# Patient Record
Sex: Male | Born: 1945
Health system: Southern US, Community
[De-identification: ages and names within clinical notes are randomized; demographics above are authoritative.]

## PROBLEM LIST (undated history)

## (undated) DIAGNOSIS — C801 Malignant (primary) neoplasm, unspecified: Secondary | ICD-10-CM

## (undated) HISTORY — PX: TONSILLECTOMY: SUR1361

## (undated) HISTORY — DX: Malignant (primary) neoplasm, unspecified: C80.1

---

## 2003-11-22 ENCOUNTER — Ambulatory Visit: Payer: Self-pay | Admitting: Internal Medicine

## 2003-12-20 ENCOUNTER — Ambulatory Visit: Payer: Self-pay | Admitting: Internal Medicine

## 2003-12-26 ENCOUNTER — Ambulatory Visit: Payer: Self-pay | Admitting: Internal Medicine

## 2004-11-14 ENCOUNTER — Ambulatory Visit: Payer: Self-pay | Admitting: Internal Medicine

## 2006-11-19 ENCOUNTER — Ambulatory Visit: Payer: Self-pay | Admitting: Internal Medicine

## 2006-11-19 LAB — CONVERTED CEMR LAB
ALT: 21 units/L (ref 0–53)
Alkaline Phosphatase: 55 units/L (ref 39–117)
BUN: 14 mg/dL (ref 6–23)
Basophils Relative: 1 % (ref 0.0–1.0)
Bilirubin, Direct: 0.1 mg/dL (ref 0.0–0.3)
CO2: 32 meq/L (ref 19–32)
Calcium: 10 mg/dL (ref 8.4–10.5)
Creatinine, Ser: 0.9 mg/dL (ref 0.4–1.5)
Eosinophils Relative: 1.9 % (ref 0.0–5.0)
Glucose, Bld: 102 mg/dL — ABNORMAL HIGH (ref 70–99)
HDL: 50.9 mg/dL (ref >39.0)
Hemoglobin: 15 g/dL (ref 13.0–17.0)
Ketones, urine, test strip: NEGATIVE
LDL Cholesterol: 118 mg/dL — ABNORMAL HIGH (ref 0–99)
Lymphocytes Relative: 29.8 % (ref 12.0–46.0)
Monocytes Absolute: 0.5 10*3/uL (ref 0.2–0.7)
Monocytes Relative: 8.2 % (ref 3.0–11.0)
Nitrite: NEGATIVE
Platelets: 318 10*3/uL (ref 150–400)
RDW: 12.5 % (ref 11.5–14.6)
Total Bilirubin: 0.9 mg/dL (ref 0.3–1.2)
Total Protein: 6.6 g/dL (ref 6.0–8.3)
Urobilinogen, UA: 0.2
VLDL: 22 mg/dL (ref 0–40)
WBC Urine, dipstick: NEGATIVE
WBC: 6.7 10*3/uL (ref 4.5–10.5)
pH: 7.5

## 2006-12-01 ENCOUNTER — Ambulatory Visit: Payer: Self-pay | Admitting: Internal Medicine

## 2008-05-03 ENCOUNTER — Encounter: Payer: Self-pay | Admitting: Internal Medicine

## 2009-05-25 ENCOUNTER — Encounter: Payer: Self-pay | Admitting: Internal Medicine

## 2009-09-17 ENCOUNTER — Encounter: Payer: Self-pay | Admitting: Internal Medicine

## 2009-10-10 ENCOUNTER — Ambulatory Visit: Payer: Self-pay | Admitting: Internal Medicine

## 2009-10-11 LAB — CONVERTED CEMR LAB
AST: 21 units/L (ref 0–37)
Alkaline Phosphatase: 62 units/L (ref 39–117)
BUN: 13 mg/dL (ref 6–23)
Bilirubin, Direct: 0.1 mg/dL (ref 0.0–0.3)
Chloride: 104 meq/L (ref 96–112)
GFR calc non Af Amer: 81.51 mL/min (ref >60)
LDL Cholesterol: 108 mg/dL — ABNORMAL HIGH (ref 0–99)
Potassium: 4.9 meq/L (ref 3.5–5.1)
Sodium: 141 meq/L (ref 135–145)
TSH: 1.19 microintl units/mL (ref 0.35–5.50)
Total Bilirubin: 0.8 mg/dL (ref 0.3–1.2)
Total CHOL/HDL Ratio: 3
VLDL: 15.2 mg/dL (ref 0.0–40.0)

## 2009-12-04 ENCOUNTER — Telehealth: Payer: Self-pay | Admitting: Internal Medicine

## 2010-02-05 NOTE — Miscellaneous (Signed)
Summary: Flu Shot/[place]  Flu Shot/[place]   Imported By: Maryln Gottron 06/22/2009 11:03:22  _____________________________________________________________________  External Attachment:    Type:   Image     Comment:   External Document

## 2010-02-05 NOTE — Assessment & Plan Note (Signed)
Summary: CPX (PT WILL COME IN FASTING) // RS   Vital Signs:  Patient profile:   65 year old male Height:      71 inches Weight:      189 pounds BMI:     26.46 Temp:     97.7 degrees F oral BP sitting:   100 / 64  (right arm) Cuff size:   regular  Vitals Entered By: Duard Brady LPN (October 10, 2009 9:46 AM) CC: cpx - doing ok - cold sx Is Patient Diabetic? No   CC:  cpx - doing ok - cold sx.  History of Present Illness: 65 year old patient who is seen today  for a comprehensive evaluation.  He enjoys excellent health.He is followed by urology for ED and testosterone deficiency  Here for Medicare AWV:  1.   Risk factors based on Past M, S, F history: no  cardiovascular risk factors 2.   Physical Activities:  walks almost daily 3.   Depression/mood: no history of depression or mood disorder 4.   Hearing: no deficits 5.   ADL's: independent in all aspects of daily living 6.   Fall Risk: low 7.   Home Safety: no cause identified 8.   Height, weight, &visual acuity:height and weight stable.  No difficulty with visual acuity 9.   Counseling: regular exercise, heart healthy diet.  All encouraged 10.   Labs ordered based on risk factors: laboratory profile, including lipid panel will be reviewed 11.           Referral Coordination- and appropriate at this time.  Follow up urology in 6 months 12.           Care Plan- continue present testosterone replacement therapy 13.            Cognitive Assessment- alert and oriented, with normal affect.  No cognitive dysfunction.  Memory intact, and able to handle all executive functions without difficulty.  Still works part-time as an Nutritional therapist    Allergies (verified): No Known Drug Allergies  Past History:  Past Medical History: Reviewed history from 12/01/2006 and no changes required. history of basal cell cancer ED androgen deficiency  Past Surgical History: Tonsillectomy colonoscopy Deboraha Sprang 2010)  Family  History: Reviewed history from 12/01/2006 and no changes required. father died age 39 of lung cancer, history of coronary artery disease mother died at age 59history of dementia  one brother died at age 21 apparent MI one older brother as well  Social History: Reviewed history from 12/01/2006 and no changes required. Married walks 4 miles [employer]  Review of Systems  The patient denies anorexia, fever, weight loss, weight gain, vision loss, decreased hearing, hoarseness, chest pain, syncope, dyspnea on exertion, peripheral edema, prolonged cough, headaches, hemoptysis, abdominal pain, melena, hematochezia, severe indigestion/heartburn, hematuria, incontinence, genital sores, muscle weakness, suspicious skin lesions, transient blindness, difficulty walking, depression, unusual weight change, abnormal bleeding, enlarged lymph nodes, angioedema, breast masses, and testicular masses.    Physical Exam  General:  Well-developed,well-nourished,in no acute distress; alert,appropriate and cooperative throughout examination Head:  Normocephalic and atraumatic without obvious abnormalities. No apparent alopecia or balding. Eyes:  No corneal or conjunctival inflammation noted. EOMI. Perrla. Funduscopic exam benign, without hemorrhages, exudates or papilledema. Vision grossly normal. Ears:  External ear exam shows no significant lesions or deformities.  Otoscopic examination reveals clear canals, tympanic membranes are intact bilaterally without bulging, retraction, inflammation or discharge. Hearing is grossly normal bilaterally. Nose:  External nasal examination shows no  deformity or inflammation. Nasal mucosa are pink and moist without lesions or exudates. Mouth:  Oral mucosa and oropharynx without lesions or exudates.  Teeth in good repair. Neck:  No deformities, masses, or tenderness noted. Chest Wall:  No deformities, masses, tenderness or gynecomastia noted. Breasts:  No masses or  gynecomastia noted Lungs:  Normal respiratory effort, chest expands symmetrically. Lungs are clear to auscultation, no crackles or wheezes. Heart:  Normal rate and regular rhythm. S1 and S2 normal without gallop, murmur, click, rub or other extra sounds. Abdomen:  Bowel sounds positive,abdomen soft and non-tender without masses, organomegaly or hernias noted. Rectal:  per urology Genitalia:  Testes bilaterally descended without nodularity, tenderness or masses. No scrotal masses or lesions. No penis lesions or urethral discharge. Prostate:   per urology Msk:  No deformity or scoliosis noted of thoracic or lumbar spine.   Pulses:  full  Extremities:  No clubbing, cyanosis, edema, or deformity noted with normal full range of motion of all joints.   Neurologic:  No cranial nerve deficits noted. Station and gait are normal. Plantar reflexes are down-going bilaterally. DTRs are symmetrical throughout. Sensory, motor and coordinative functions appear intact. Skin:  Intact without suspicious lesions or rashes Cervical Nodes:  No lymphadenopathy noted Axillary Nodes:  No palpable lymphadenopathy Inguinal Nodes:  No significant adenopathy Psych:  Cognition and judgment appear intact. Alert and cooperative with normal attention span and concentration. No apparent delusions, illusions, hallucinations   Impression & Recommendations:  Problem # 1:  PHYSICAL EXAMINATION (ICD-V70.0)  Orders: EKG w/ Interpretation (93000) Medicare -1st Annual Wellness Visit [phone]) Venipuncture (98119) Specimen Handling (14782) TLB-Lipid Panel (80061-LIPID) TLB-BMP (Basic Metabolic Panel-BMET) (80048-METABOL) TLB-Hepatic/Liver Function Pnl (80076-HEPATIC) TLB-TSH (Thyroid Stimulating Hormone) (84443-TSH)  Complete Medication List: 1)  Androgel 25 Mg/2.5gm Gel (Testosterone) .... As dir 2)  Cialis 20 Mg Tabs (Tadalafil)  Other Orders: Flu Vaccine 1yrs + (95621) Admin 1st Vaccine (30865)  Patient  Instructions: 1)  Please schedule a follow-up appointment in 1 year. 2)  Limit your Sodium (Salt). 3)  It is important that you exercise regularly at least 20 minutes 5 times a week. If you develop chest pain, have severe difficulty breathing, or feel very tired , stop exercising immediately and seek medical attention.   Immunizations Administered:  Influenza Vaccine # 1:    Vaccine Type: Fluvax 3+    Site: left deltoid    Mfr: GlaxoSmithKline    Dose: 0.5 ml    Route: IM    Given by: Duard Brady LPN    Exp. Date: 07/06/2010    Lot #: [id number]    VIS given: 07/31/09 version given October 10, 2009.    Physician counseled: yes  Flu Vaccine Consent Questions:    Do you have a history of severe allergic reactions to this vaccine? no    Any prior history of allergic reactions to egg and/or gelatin? no    Do you have a sensitivity to the preservative Thimersol? no    Do you have a past history of Guillan-Barre Syndrome? no    Do you currently have an acute febrile illness? no    Have you ever had a severe reaction to latex? no    Vaccine information given and explained to patient? yes

## 2010-02-05 NOTE — Letter (Signed)
Summary: [facility]   Imported By: Maryln Gottron 06/05/2009 10:47:21  _____________________________________________________________________  External Attachment:    Type:   Image     Comment:   External Document

## 2010-02-05 NOTE — Letter (Signed)
Summary: [facility]  [facility]   Imported By: Maryln Gottron 09/25/2009 14:19:09  _____________________________________________________________________  External Attachment:    Type:   Image     Comment:   External Document

## 2010-02-07 NOTE — Progress Notes (Signed)
Summary: Pt says medicare did not cover his cpx in Oct 2011  Phone Note Call from Patient Call back at [phone] cell   Caller: Patient Summary of Call: Pt called and said that he rcvd a notice from Medicare, stating that they are not going to cover his cpx he had done in Oct 2011, due to possible coding error. Pt says that his secondary insurance, probably won't cover either. Pt is req that Dr.Jarris Kortz recodes cpx, so that Medicare will cover.   Initial call taken by: Lucy Antigua,  December 04, 2009 10:40 AM  Follow-up for Phone Call        Charge correction sent Follow-up by: Trixie Dredge,  January 02, 2010 10:15 AM

## 2011-04-24 DIAGNOSIS — E291 Testicular hypofunction: Secondary | ICD-10-CM | POA: Diagnosis not present

## 2011-04-24 DIAGNOSIS — N400 Enlarged prostate without lower urinary tract symptoms: Secondary | ICD-10-CM | POA: Diagnosis not present

## 2011-05-01 DIAGNOSIS — N400 Enlarged prostate without lower urinary tract symptoms: Secondary | ICD-10-CM | POA: Diagnosis not present

## 2011-05-01 DIAGNOSIS — N529 Male erectile dysfunction, unspecified: Secondary | ICD-10-CM | POA: Diagnosis not present

## 2011-05-01 DIAGNOSIS — E291 Testicular hypofunction: Secondary | ICD-10-CM | POA: Diagnosis not present

## 2011-05-29 DIAGNOSIS — L821 Other seborrheic keratosis: Secondary | ICD-10-CM | POA: Diagnosis not present

## 2011-05-29 DIAGNOSIS — D485 Neoplasm of uncertain behavior of skin: Secondary | ICD-10-CM | POA: Diagnosis not present

## 2011-05-29 DIAGNOSIS — D239 Other benign neoplasm of skin, unspecified: Secondary | ICD-10-CM | POA: Diagnosis not present

## 2011-05-29 DIAGNOSIS — C44211 Basal cell carcinoma of skin of unspecified ear and external auricular canal: Secondary | ICD-10-CM | POA: Diagnosis not present

## 2011-05-29 DIAGNOSIS — Z85828 Personal history of other malignant neoplasm of skin: Secondary | ICD-10-CM | POA: Diagnosis not present

## 2011-05-29 DIAGNOSIS — L719 Rosacea, unspecified: Secondary | ICD-10-CM | POA: Diagnosis not present

## 2011-08-07 DIAGNOSIS — C44319 Basal cell carcinoma of skin of other parts of face: Secondary | ICD-10-CM | POA: Diagnosis not present

## 2011-10-03 DIAGNOSIS — Z23 Encounter for immunization: Secondary | ICD-10-CM | POA: Diagnosis not present

## 2011-10-24 DIAGNOSIS — E291 Testicular hypofunction: Secondary | ICD-10-CM | POA: Diagnosis not present

## 2011-11-03 DIAGNOSIS — E291 Testicular hypofunction: Secondary | ICD-10-CM | POA: Diagnosis not present

## 2011-11-03 DIAGNOSIS — N529 Male erectile dysfunction, unspecified: Secondary | ICD-10-CM | POA: Diagnosis not present

## 2011-11-25 DIAGNOSIS — E291 Testicular hypofunction: Secondary | ICD-10-CM | POA: Diagnosis not present

## 2011-11-27 ENCOUNTER — Other Ambulatory Visit (INDEPENDENT_AMBULATORY_CARE_PROVIDER_SITE_OTHER): Payer: Medicare Other

## 2011-11-27 DIAGNOSIS — Z0000 Encounter for general adult medical examination without abnormal findings: Secondary | ICD-10-CM

## 2011-11-27 LAB — CBC WITH DIFFERENTIAL/PLATELET
Basophils Absolute: 0 10*3/uL (ref 0.0–0.1)
HCT: 46.4 % (ref 39.0–52.0)
Hemoglobin: 15.3 g/dL (ref 13.0–17.0)
Lymphs Abs: 1.7 10*3/uL (ref 0.7–4.0)
MCV: 93.5 fl (ref 78.0–100.0)
Monocytes Absolute: 0.5 10*3/uL (ref 0.1–1.0)
Monocytes Relative: 8.8 % (ref 3.0–12.0)
Neutro Abs: 3.1 10*3/uL (ref 1.4–7.7)
RDW: 13.2 % (ref 11.5–14.6)

## 2011-11-27 LAB — LIPID PANEL
Total CHOL/HDL Ratio: 3
Triglycerides: 75 mg/dL (ref 0.0–149.0)

## 2011-11-27 LAB — HEPATIC FUNCTION PANEL
Bilirubin, Direct: 0.1 mg/dL (ref 0.0–0.3)
Total Bilirubin: 0.8 mg/dL (ref 0.3–1.2)

## 2011-11-27 LAB — BASIC METABOLIC PANEL
CO2: 29 mEq/L (ref 19–32)
Calcium: 9.4 mg/dL (ref 8.4–10.5)
Creatinine, Ser: 0.9 mg/dL (ref 0.4–1.5)
Glucose, Bld: 108 mg/dL — ABNORMAL HIGH (ref 70–99)

## 2011-11-27 LAB — PSA: PSA: 0.74 ng/mL (ref 0.10–4.00)

## 2011-11-27 LAB — POCT URINALYSIS DIPSTICK
Glucose, UA: NEGATIVE
Nitrite, UA: NEGATIVE
Urobilinogen, UA: 0.2

## 2011-12-01 ENCOUNTER — Ambulatory Visit (INDEPENDENT_AMBULATORY_CARE_PROVIDER_SITE_OTHER): Payer: Medicare Other | Admitting: Internal Medicine

## 2011-12-01 ENCOUNTER — Encounter: Payer: Self-pay | Admitting: Internal Medicine

## 2011-12-01 VITALS — BP 120/72 | HR 88 | Temp 97.6°F | Resp 18 | Wt 190.0 lb

## 2011-12-01 DIAGNOSIS — Z0000 Encounter for general adult medical examination without abnormal findings: Secondary | ICD-10-CM | POA: Diagnosis not present

## 2011-12-01 DIAGNOSIS — Z8249 Family history of ischemic heart disease and other diseases of the circulatory system: Secondary | ICD-10-CM | POA: Diagnosis not present

## 2011-12-01 DIAGNOSIS — Z23 Encounter for immunization: Secondary | ICD-10-CM

## 2011-12-01 NOTE — Progress Notes (Signed)
Subjective:    Patient ID: Donald Montgomery, male    DOB: 1945-08-07, 66 y.o.   MRN: [id number]  HPI   History of Present Illness:   66 year-old patient who is seen today for a comprehensive evaluation. He enjoys excellent health.He is followed by urology for ED and testosterone deficiency   Here for Medicare AWV:   1. Risk factors based on Past M, S, F history: no cardiovascular risk factors  2. Physical Activities: walks almost daily  3. Depression/mood: no history of depression or mood disorder  4. Hearing: no deficits  5. ADL's: independent in all aspects of daily living  6. Fall Risk: low  7. Home Safety: no cause identified  8. Height, weight, &visual acuity:height and weight stable. No difficulty with visual acuity  9. Counseling: regular exercise, heart healthy diet. All encouraged  10. Labs ordered based on risk factors: laboratory profile, including lipid panel will be reviewed  11. Referral Coordination- and appropriate at this time. Follow up urology in 6 months  12. Care Plan- continue present testosterone replacement therapy  13. Cognitive Assessment- alert and oriented, with normal affect. No cognitive dysfunction. Memory intact, and able to handle all executive functions without difficulty. Still works part-time as an Nutritional therapist   Allergies (verified):  No Known Drug Allergies   Past History:  Past Medical History:  Reviewed history from 12/01/2006 and no changes required.  history of basal cell cancer  ED  androgen deficiency   Past Surgical History:  Tonsillectomy  colonoscopy Deboraha Sprang 2010)   Family History:  Reviewed history from 12/01/2006 and no changes required.  father died age 67 of lung cancer, history of coronary artery disease  mother died at age 57; history of dementia  one brother died at age 2 apparent MI  one older brother  died of a cerebral hemorrhage at age 27  Social History:  Reviewed history from 12/01/2006 and no changes  required.  Married  walks 4 miles daily  [employer]     Review of Systems  Constitutional: Negative for fever, chills, activity change, appetite change and fatigue.  HENT: Positive for rhinorrhea and postnasal drip. Negative for hearing loss, ear pain, congestion, sneezing, mouth sores, trouble swallowing, neck pain, neck stiffness, dental problem, voice change, sinus pressure and tinnitus.   Eyes: Negative for photophobia, pain, redness and visual disturbance.  Respiratory: Negative for apnea, cough, choking, chest tightness, shortness of breath and wheezing.   Cardiovascular: Negative for chest pain, palpitations and leg swelling.  Gastrointestinal: Negative for nausea, vomiting, abdominal pain, diarrhea, constipation, blood in stool, abdominal distention, anal bleeding and rectal pain.  Genitourinary: Negative for dysuria, urgency, frequency, hematuria, flank pain, decreased urine volume, discharge, penile swelling, scrotal swelling, difficulty urinating, genital sores and testicular pain.  Musculoskeletal: Negative for myalgias, back pain, joint swelling, arthralgias and gait problem.  Skin: Negative for color change, rash and wound.  Neurological: Negative for dizziness, tremors, seizures, syncope, facial asymmetry, speech difficulty, weakness, light-headedness, numbness and headaches.  Hematological: Negative for adenopathy. Does not bruise/bleed easily.  Psychiatric/Behavioral: Negative for suicidal ideas, hallucinations, behavioral problems, confusion, sleep disturbance, self-injury, dysphoric mood, decreased concentration and agitation. The patient is not nervous/anxious.        Objective:   Physical Exam  Constitutional: He appears well-developed and well-nourished.  HENT:  Head: Normocephalic and atraumatic.  Right Ear: External ear normal.  Left Ear: External ear normal.  Nose: Nose normal.  Mouth/Throat: Oropharynx is clear and moist.  Eyes:  Conjunctivae normal and  EOM are normal. Pupils are equal, round, and reactive to light. No scleral icterus.  Neck: Normal range of motion. Neck supple. No JVD present. No thyromegaly present.  Cardiovascular: Regular rhythm, normal heart sounds and intact distal pulses.  Exam reveals no gallop and no friction rub.   No murmur heard. Pulmonary/Chest: Effort normal and breath sounds normal. He exhibits no tenderness.  Abdominal: Soft. Bowel sounds are normal. He exhibits no distension and no mass. There is no tenderness.  Genitourinary: Prostate normal and penis normal.  Musculoskeletal: Normal range of motion. He exhibits no edema and no tenderness.  Lymphadenopathy:    He has no cervical adenopathy.  Neurological: He is alert. He has normal reflexes. No cranial nerve deficit. Coordination normal.  Skin: Skin is warm and dry. No rash noted.  Psychiatric: He has a normal mood and affect. His behavior is normal.          Assessment & Plan:   Preventive health exam Testosterone deficiency. Followup urology  Return here in one year or as needed

## 2011-12-01 NOTE — Patient Instructions (Signed)
It is important that you exercise regularly, at least 20 minutes 3 to 4 times per week.  If you develop chest pain or shortness of breath seek  medical attention.  Return in one year for follow-up  Urology followup as planned

## 2012-01-02 DIAGNOSIS — E291 Testicular hypofunction: Secondary | ICD-10-CM | POA: Diagnosis not present

## 2012-01-09 ENCOUNTER — Ambulatory Visit (INDEPENDENT_AMBULATORY_CARE_PROVIDER_SITE_OTHER): Payer: Medicare Other | Admitting: Internal Medicine

## 2012-01-09 DIAGNOSIS — Z2911 Encounter for prophylactic immunotherapy for respiratory syncytial virus (RSV): Secondary | ICD-10-CM

## 2012-01-09 DIAGNOSIS — Z23 Encounter for immunization: Secondary | ICD-10-CM

## 2012-04-30 DIAGNOSIS — E291 Testicular hypofunction: Secondary | ICD-10-CM | POA: Diagnosis not present

## 2012-04-30 DIAGNOSIS — N400 Enlarged prostate without lower urinary tract symptoms: Secondary | ICD-10-CM | POA: Diagnosis not present

## 2012-05-05 DIAGNOSIS — E291 Testicular hypofunction: Secondary | ICD-10-CM | POA: Diagnosis not present

## 2012-05-05 DIAGNOSIS — N400 Enlarged prostate without lower urinary tract symptoms: Secondary | ICD-10-CM | POA: Diagnosis not present

## 2012-05-05 DIAGNOSIS — N529 Male erectile dysfunction, unspecified: Secondary | ICD-10-CM | POA: Diagnosis not present

## 2012-06-29 DIAGNOSIS — H4910 Fourth [trochlear] nerve palsy, unspecified eye: Secondary | ICD-10-CM | POA: Diagnosis not present

## 2012-07-05 DIAGNOSIS — Z85828 Personal history of other malignant neoplasm of skin: Secondary | ICD-10-CM | POA: Diagnosis not present

## 2012-07-05 DIAGNOSIS — D485 Neoplasm of uncertain behavior of skin: Secondary | ICD-10-CM | POA: Diagnosis not present

## 2012-07-05 DIAGNOSIS — D239 Other benign neoplasm of skin, unspecified: Secondary | ICD-10-CM | POA: Diagnosis not present

## 2012-07-05 DIAGNOSIS — L719 Rosacea, unspecified: Secondary | ICD-10-CM | POA: Diagnosis not present

## 2012-07-05 DIAGNOSIS — C44319 Basal cell carcinoma of skin of other parts of face: Secondary | ICD-10-CM | POA: Diagnosis not present

## 2012-07-05 DIAGNOSIS — L821 Other seborrheic keratosis: Secondary | ICD-10-CM | POA: Diagnosis not present

## 2012-08-06 ENCOUNTER — Ambulatory Visit (INDEPENDENT_AMBULATORY_CARE_PROVIDER_SITE_OTHER): Payer: Medicare Other | Admitting: Internal Medicine

## 2012-08-06 ENCOUNTER — Encounter: Payer: Self-pay | Admitting: Internal Medicine

## 2012-08-06 VITALS — BP 120/80 | HR 69 | Temp 98.5°F | Resp 20 | Wt 188.0 lb

## 2012-08-06 DIAGNOSIS — H00019 Hordeolum externum unspecified eye, unspecified eyelid: Secondary | ICD-10-CM | POA: Diagnosis not present

## 2012-08-06 DIAGNOSIS — H00013 Hordeolum externum right eye, unspecified eyelid: Secondary | ICD-10-CM

## 2012-08-06 DIAGNOSIS — J069 Acute upper respiratory infection, unspecified: Secondary | ICD-10-CM | POA: Diagnosis not present

## 2012-08-06 MED ORDER — HYDROCODONE-HOMATROPINE 5-1.5 MG/5ML PO SYRP: 5.0000 mL | ORAL_SOLUTION | Freq: Four times a day (QID) | ORAL | Status: AC | PRN

## 2012-08-06 MED ORDER — SULFACETAMIDE SODIUM 10 % OP SOLN: 2.0000 [drp] | OPHTHALMIC | Status: DC

## 2012-08-06 NOTE — Progress Notes (Signed)
  Subjective:    Patient ID: Donald Montgomery, male    DOB: Jun 28, 1945, 67 y.o.   MRN: [id number]  HPI  67 year old patient who presents with a five-day history of cough. Cough is largely nonproductive but quite incessant and interferes with sleep. No fever chills or shortness of breath. He also describes some discomfort involving his right lower eyelid.  History reviewed. No pertinent past medical history.  History   Social History  . Marital Status: Married    Spouse Name: N/A    Number of Children: N/A  . Years of Education: N/A   Occupational History  . Not on file.   Social History Main Topics  . Smoking status: Former Smoker    Types: Cigarettes, Pipe  . Smokeless tobacco: Never Used  . Alcohol Use: No  . Drug Use: No  . Sexually Active: Not on file   Other Topics Concern  . Not on file   Social History Narrative  . No narrative on file    History reviewed. No pertinent past surgical history.  No family history on file.  No Known Allergies  Current Outpatient Prescriptions on File Prior to Visit  Medication Sig Dispense Refill  . ANDROGEL PUMP 20.25 MG/ACT (1.62%) GEL        No current facility-administered medications on file prior to visit.    BP 120/80  Pulse 69  Temp(Src) 98.5 F (36.9 C) (Oral)  Resp 20  Wt 188 lb (85.276 kg)  BMI 26.23 kg/m2  SpO2 96%       Review of Systems  Constitutional: Negative for fever, chills, appetite change and fatigue.  HENT: Negative for hearing loss, ear pain, congestion, sore throat, trouble swallowing, neck stiffness, dental problem, voice change and tinnitus.   Eyes: Positive for redness and itching. Negative for pain, discharge and visual disturbance.  Respiratory: Positive for cough. Negative for chest tightness, wheezing and stridor.   Cardiovascular: Negative for chest pain, palpitations and leg swelling.  Gastrointestinal: Negative for nausea, vomiting, abdominal pain, diarrhea, constipation, blood in  stool and abdominal distention.  Genitourinary: Negative for urgency, hematuria, flank pain, discharge, difficulty urinating and genital sores.  Musculoskeletal: Negative for myalgias, back pain, joint swelling, arthralgias and gait problem.  Skin: Negative for rash.  Neurological: Negative for dizziness, syncope, speech difficulty, weakness, numbness and headaches.  Hematological: Negative for adenopathy. Does not bruise/bleed easily.  Psychiatric/Behavioral: Negative for behavioral problems and dysphoric mood. The patient is not nervous/anxious.        Objective:   Physical Exam  Constitutional: He is oriented to person, place, and time. He appears well-developed.  HENT:  Head: Normocephalic.  Right Ear: External ear normal.  Left Ear: External ear normal.  Eyes: Conjunctivae and EOM are normal.  A very small stye involving the right outer lower lid  Neck: Normal range of motion.  Cardiovascular: Normal rate and normal heart sounds.   Pulmonary/Chest: Breath sounds normal.  Abdominal: Bowel sounds are normal.  Musculoskeletal: Normal range of motion. He exhibits no edema and no tenderness.  Neurological: He is alert and oriented to person, place, and time.  Psychiatric: He has a normal mood and affect. His behavior is normal.          Assessment & Plan:   Lower URI with cough. Stye right eye  We'll treat symptomatically. Also place on ophthalmic drops

## 2012-08-06 NOTE — Patient Instructions (Signed)
Acute bronchitis symptoms for less than 10 days are generally not helped by antibiotics.  Take over-the-counter expectorants and cough medications such as  Mucinex DM.  Call if there is no improvement in 5 to 7 days or if he developed worsening cough, fever, or new symptoms, such as shortness of breath or chest pain.  Sty A sty (hordeolum) is an infection of a gland in the eyelid located at the base of the eyelash. A sty may develop a white or yellow head of pus. It can be puffy (swollen). Usually, the sty will burst and pus will come out on its own. They do not leave lumps in the eyelid once they drain. A sty is often confused with another form of cyst of the eyelid called a chalazion. Chalazions occur within the eyelid and not on the edge where the bases of the eyelashes are. They often are red, sore and then form firm lumps in the eyelid. CAUSES   Germs (bacteria).  Lasting (chronic) eyelid inflammation. SYMPTOMS   Tenderness, redness and swelling along the edge of the eyelid at the base of the eyelashes.  Sometimes, there is a white or yellow head of pus. It may or may not drain. DIAGNOSIS  An ophthalmologist will be able to distinguish between a sty and a chalazion and treat the condition appropriately.  TREATMENT   Styes are typically treated with warm packs (compresses) until drainage occurs.  In rare cases, medicines that kill germs (antibiotics) may be prescribed. These antibiotics may be in the form of drops, cream or pills.  If a hard lump has formed, it is generally necessary to do a small incision and remove the hardened contents of the cyst in a minor surgical procedure done in the office.  In suspicious cases, your caregiver may send the contents of the cyst to the lab to be certain that it is not a rare, but dangerous form of cancer of the glands of the eyelid. HOME CARE INSTRUCTIONS   Wash your hands often and dry them with a clean towel. Avoid touching your eyelid. This  may spread the infection to other parts of the eye.  Apply heat to your eyelid for 10 to 20 minutes, several times a day, to ease pain and help to heal it faster.  Do not squeeze the sty. Allow it to drain on its own. Wash your eyelid carefully 3 to 4 times per day to remove any pus. SEEK IMMEDIATE MEDICAL CARE IF:   Your eye becomes painful or puffy (swollen).  Your vision changes.  Your sty does not drain by itself within 3 days.  Your sty comes back within a short period of time, even with treatment.  You have redness (inflammation) around the eye.  You have a fever. Document Released: 10/02/2004 Document Revised: 03/17/2011 Document Reviewed: 06/06/2008 [facility] Patient Information 2014 [place], [place].

## 2012-08-12 DIAGNOSIS — C44319 Basal cell carcinoma of skin of other parts of face: Secondary | ICD-10-CM | POA: Diagnosis not present

## 2012-08-13 ENCOUNTER — Telehealth: Payer: Self-pay | Admitting: Internal Medicine

## 2012-08-13 NOTE — Telephone Encounter (Signed)
Patient Information:  Caller Name: Jaser  Phone: [phone]  Patient: Donald Montgomery, Donald Montgomery  Gender: Male  DOB: Sep 01, 1945  Age: 67 Years  PCP: Eleonore Chiquito [facility])  Office Follow Up:  Does the office need to follow up with this patient?: Yes  Instructions For The Office: Needs call back regarding when to stop taking prescribed eye drops.  RN Note:  Reviewed Epic chart, there are no instructions listed on when to stop taking the eye drops he started on 08/06/12. Please contact patient regarding when to stop eye drops. Thanks.  Symptoms  Reason For Call & Symptoms: Patient is not sure how long he is supposed to take eye drops.  Reviewed Health History In EMR: N/A  Reviewed Medications In EMR: N/A  Reviewed Allergies In EMR: N/A  Reviewed Surgeries / Procedures: N/A  Date of Onset of Symptoms: Unknown  Guideline(s) Used:  No Protocol Available - Information Only  Disposition Per Guideline:   Discuss with PCP and Callback by Nurse Today  Reason For Disposition Reached:   Nursing judgment  Advice Given:  N/A  Patient Will Follow Care Advice:  YES

## 2012-08-13 NOTE — Telephone Encounter (Signed)
Okay to discontinue eyedrops now  If stye has resolved

## 2012-08-13 NOTE — Telephone Encounter (Signed)
Patient is aware 

## 2012-09-29 DIAGNOSIS — Z23 Encounter for immunization: Secondary | ICD-10-CM | POA: Diagnosis not present

## 2012-10-29 DIAGNOSIS — E291 Testicular hypofunction: Secondary | ICD-10-CM | POA: Diagnosis not present

## 2012-11-03 DIAGNOSIS — N529 Male erectile dysfunction, unspecified: Secondary | ICD-10-CM | POA: Diagnosis not present

## 2012-11-03 DIAGNOSIS — N400 Enlarged prostate without lower urinary tract symptoms: Secondary | ICD-10-CM | POA: Diagnosis not present

## 2012-11-03 DIAGNOSIS — E291 Testicular hypofunction: Secondary | ICD-10-CM | POA: Diagnosis not present

## 2012-11-11 ENCOUNTER — Other Ambulatory Visit: Payer: Self-pay

## 2013-04-29 ENCOUNTER — Ambulatory Visit (INDEPENDENT_AMBULATORY_CARE_PROVIDER_SITE_OTHER): Payer: Medicare Other | Admitting: Internal Medicine

## 2013-04-29 ENCOUNTER — Encounter: Payer: Self-pay | Admitting: Internal Medicine

## 2013-04-29 VITALS — BP 110/72 | HR 60 | Temp 97.8°F | Resp 20 | Ht 71.0 in | Wt 195.0 lb

## 2013-04-29 DIAGNOSIS — E349 Endocrine disorder, unspecified: Secondary | ICD-10-CM | POA: Insufficient documentation

## 2013-04-29 DIAGNOSIS — N529 Male erectile dysfunction, unspecified: Secondary | ICD-10-CM | POA: Diagnosis not present

## 2013-04-29 DIAGNOSIS — E291 Testicular hypofunction: Secondary | ICD-10-CM

## 2013-04-29 DIAGNOSIS — M1990 Unspecified osteoarthritis, unspecified site: Secondary | ICD-10-CM

## 2013-04-29 DIAGNOSIS — J309 Allergic rhinitis, unspecified: Secondary | ICD-10-CM

## 2013-04-29 DIAGNOSIS — E785 Hyperlipidemia, unspecified: Secondary | ICD-10-CM

## 2013-04-29 LAB — LIPID PANEL
Cholesterol: 181 mg/dL (ref 0–200)
HDL: 59.4 mg/dL (ref >39.00)
LDL Cholesterol: 111 mg/dL — ABNORMAL HIGH (ref 0–99)
TRIGLYCERIDES: 53 mg/dL (ref 0.0–149.0)
Total CHOL/HDL Ratio: 3
VLDL: 10.6 mg/dL (ref 0.0–40.0)

## 2013-04-29 LAB — COMPREHENSIVE METABOLIC PANEL
ALK PHOS: 55 U/L (ref 39–117)
ALT: 21 U/L (ref 0–53)
AST: 21 U/L (ref 0–37)
Albumin: 4.1 g/dL (ref 3.5–5.2)
BILIRUBIN TOTAL: 0.9 mg/dL (ref 0.3–1.2)
BUN: 20 mg/dL (ref 6–23)
CO2: 27 meq/L (ref 19–32)
CREATININE: 0.8 mg/dL (ref 0.4–1.5)
Calcium: 9.6 mg/dL (ref 8.4–10.5)
Chloride: 104 mEq/L (ref 96–112)
GFR: 97.68 mL/min (ref >60.00)
GLUCOSE: 97 mg/dL (ref 70–99)
Potassium: 4.5 mEq/L (ref 3.5–5.1)
Sodium: 137 mEq/L (ref 135–145)
Total Protein: 6.9 g/dL (ref 6.0–8.3)

## 2013-04-29 LAB — CBC WITH DIFFERENTIAL/PLATELET
BASOS PCT: 0.3 % (ref 0.0–3.0)
Basophils Absolute: 0 10*3/uL (ref 0.0–0.1)
EOS PCT: 1.7 % (ref 0.0–5.0)
Eosinophils Absolute: 0.1 10*3/uL (ref 0.0–0.7)
HEMATOCRIT: 48.3 % (ref 39.0–52.0)
HEMOGLOBIN: 16.2 g/dL (ref 13.0–17.0)
LYMPHS ABS: 2.2 10*3/uL (ref 0.7–4.0)
Lymphocytes Relative: 35.4 % (ref 12.0–46.0)
MCHC: 33.5 g/dL (ref 30.0–36.0)
MCV: 92.3 fl (ref 78.0–100.0)
MONOS PCT: 9 % (ref 3.0–12.0)
Monocytes Absolute: 0.6 10*3/uL (ref 0.1–1.0)
NEUTROS ABS: 3.4 10*3/uL (ref 1.4–7.7)
Neutrophils Relative %: 53.6 % (ref 43.0–77.0)
PLATELETS: 275 10*3/uL (ref 150.0–400.0)
RBC: 5.24 Mil/uL (ref 4.22–5.81)
RDW: 13.4 % (ref 11.5–14.6)
WBC: 6.3 10*3/uL (ref 4.5–10.5)

## 2013-04-29 LAB — TSH: TSH: 1.14 u[IU]/mL (ref 0.35–5.50)

## 2013-04-29 NOTE — Patient Instructions (Signed)
It is important that you exercise regularly, at least 20 minutes 3 to 4 times per week.  If you develop chest pain or shortness of breath seek  medical attention.  Return in 6 months for follow-up  

## 2013-04-29 NOTE — Progress Notes (Signed)
Pre-visit discussion using our clinic review tool. No additional management support is needed unless otherwise documented below in the visit note.  

## 2013-04-29 NOTE — Progress Notes (Signed)
Subjective:    Patient ID: Donald Montgomery, male    DOB: March 16, 1945, 68 y.o.   MRN: [id number]  HPI 68 year old patient who enjoys excellent health.  He is seen here with 2 concerns.  He complains of some minor neck pain and decreased range of motion especially side to side movement.  This has been a problem for years and does not seem very progressive. His chief complaint is a constant need to clear his throat.  There is no subjective postnasal drip or productive cough.  This likewise has been a problem for a number of years.  Does not seem to be seasonal, but appears to be a year-round problem.  History reviewed. No pertinent past medical history.  History   Social History  . Marital Status: Married    Spouse Name: N/A    Number of Children: N/A  . Years of Education: N/A   Occupational History  . Not on file.   Social History Main Topics  . Smoking status: Former Smoker    Types: Cigarettes, Pipe  . Smokeless tobacco: Never Used  . Alcohol Use: No  . Drug Use: No  . Sexual Activity: Not on file   Other Topics Concern  . Not on file   Social History Narrative  . No narrative on file    History reviewed. No pertinent past surgical history.  No family history on file.  No Known Allergies  Current Outpatient Prescriptions on File Prior to Visit  Medication Sig Dispense Refill  . ANDROGEL PUMP 20.25 MG/ACT (1.62%) GEL       . Coenzyme Q10 (CO Q 10 PO) Take 1 capsule by mouth daily.      . Omega-3 Fatty Acids (FISH OIL) 1000 MG CAPS Take 1 capsule by mouth daily.      . tadalafil (CIALIS) 5 MG tablet Take 2.5 mg by mouth daily as needed for erectile dysfunction.       No current facility-administered medications on file prior to visit.    BP 110/72  Pulse 60  Temp(Src) 97.8 F (36.6 C) (Oral)  Resp 20  Ht 5\' 11"  (1.803 m)  Wt 195 lb (88.451 kg)  BMI 27.21 kg/m2  SpO2 96%       Review of Systems  Constitutional: Negative for fever, chills, appetite  change and fatigue.  HENT: Negative for congestion, dental problem, ear pain, hearing loss, sore throat, tinnitus, trouble swallowing and voice change.   Eyes: Negative for pain, discharge and visual disturbance.  Respiratory: Positive for cough. Negative for chest tightness, wheezing and stridor.   Cardiovascular: Negative for chest pain, palpitations and leg swelling.  Gastrointestinal: Negative for nausea, vomiting, abdominal pain, diarrhea, constipation, blood in stool and abdominal distention.  Genitourinary: Negative for urgency, hematuria, flank pain, discharge, difficulty urinating and genital sores.  Musculoskeletal: Negative for arthralgias, back pain, gait problem, joint swelling, myalgias and neck stiffness.  Skin: Negative for rash.  Neurological: Negative for dizziness, syncope, speech difficulty, weakness, numbness and headaches.  Hematological: Negative for adenopathy. Does not bruise/bleed easily.  Psychiatric/Behavioral: Negative for behavioral problems and dysphoric mood. The patient is not nervous/anxious.        Objective:   Physical Exam  Constitutional: He is oriented to person, place, and time. He appears well-developed.  HENT:  Head: Normocephalic.  Right Ear: External ear normal.  Left Ear: External ear normal.  Eyes: Conjunctivae and EOM are normal.  Neck: Normal range of motion.  Cardiovascular: Normal rate and normal heart  sounds.   Pulmonary/Chest: Breath sounds normal.  Abdominal: Bowel sounds are normal.  Musculoskeletal: Normal range of motion. He exhibits no edema and no tenderness.  Mild decreased range of motion of the neck  Neurological: He is alert and oriented to person, place, and time.  Psychiatric: He has a normal mood and affect. His behavior is normal.          Assessment & Plan:   Probable mild cervical DJD.  Gentle range of motion warm compresses encouraged.  We'll consider when necessary use of Aleve for symptom control Probable  perennial rhinitis.  Symptoms are fairly minor.  He will consider OTC antihistamines and possibly nasal steroid  CPX 6 months

## 2013-05-09 DIAGNOSIS — E291 Testicular hypofunction: Secondary | ICD-10-CM | POA: Diagnosis not present

## 2013-05-09 DIAGNOSIS — N400 Enlarged prostate without lower urinary tract symptoms: Secondary | ICD-10-CM | POA: Diagnosis not present

## 2013-05-11 DIAGNOSIS — E291 Testicular hypofunction: Secondary | ICD-10-CM | POA: Diagnosis not present

## 2013-05-11 DIAGNOSIS — N400 Enlarged prostate without lower urinary tract symptoms: Secondary | ICD-10-CM | POA: Diagnosis not present

## 2013-05-11 DIAGNOSIS — N529 Male erectile dysfunction, unspecified: Secondary | ICD-10-CM | POA: Diagnosis not present

## 2013-05-11 DIAGNOSIS — K4090 Unilateral inguinal hernia, without obstruction or gangrene, not specified as recurrent: Secondary | ICD-10-CM | POA: Diagnosis not present

## 2013-06-13 ENCOUNTER — Ambulatory Visit (INDEPENDENT_AMBULATORY_CARE_PROVIDER_SITE_OTHER): Payer: Self-pay | Admitting: Surgery

## 2013-06-22 ENCOUNTER — Ambulatory Visit (INDEPENDENT_AMBULATORY_CARE_PROVIDER_SITE_OTHER): Payer: Medicare Other | Admitting: Surgery

## 2013-06-22 ENCOUNTER — Encounter (INDEPENDENT_AMBULATORY_CARE_PROVIDER_SITE_OTHER): Payer: Self-pay | Admitting: Surgery

## 2013-06-22 VITALS — BP 124/78 | HR 83 | Temp 97.5°F | Ht 71.0 in | Wt 192.0 lb

## 2013-06-22 DIAGNOSIS — K4090 Unilateral inguinal hernia, without obstruction or gangrene, not specified as recurrent: Secondary | ICD-10-CM | POA: Insufficient documentation

## 2013-06-22 HISTORY — DX: Unilateral inguinal hernia, without obstruction or gangrene, not specified as recurrent: K40.90

## 2013-06-22 NOTE — Progress Notes (Signed)
General Surgery [facility]  Chief Complaint  Patient presents with  . New Evaluation    evaluate left inguinal hernia - referral from Dr. Irine Seal    HISTORY: Patient is a 68 year old male referred by his urologist with symptomatic left inguinal hernia. This is been present for approximately one year. It became larger this past winter when the patient had a chronic cough. He now notes intermittent discomfort and a persistent bulge. Hernia has always been reducible. He denies any signs or symptoms of obstruction.  No prior abdominal surgery. No prior hernia repairs.  Past Medical History  Diagnosis Date  . Cancer     Current Outpatient Prescriptions  Medication Sig Dispense Refill  . ANDROGEL PUMP 20.25 MG/ACT (1.62%) GEL       . Coenzyme Q10 (CO Q 10 PO) Take 1 capsule by mouth daily.      . Omega-3 Fatty Acids (FISH OIL) 1000 MG CAPS Take 1 capsule by mouth daily.      . tadalafil (CIALIS) 5 MG tablet Take 2.5 mg by mouth daily as needed for erectile dysfunction.       No current facility-administered medications for this visit.    No Known Allergies  Family History  Problem Relation Age of Onset  . Heart disease Mother     History   Social History  . Marital Status: Married    Spouse Name: N/A    Number of Children: N/A  . Years of Education: N/A   Social History Main Topics  . Smoking status: Former Smoker    Types: Cigarettes, Pipe  . Smokeless tobacco: Never Used  . Alcohol Use: No  . Drug Use: No  . Sexual Activity: None   Other Topics Concern  . None   Social History Narrative  . None    REVIEW OF SYSTEMS - PERTINENT POSITIVES ONLY: Denies signs or symptoms of obstruction. Always reducible. Intermittent discomfort.  EXAM: Filed Vitals:   06/22/13 0909  BP: 124/78  Pulse: 83  Temp: 97.5 F (36.4 C)    GENERAL: well-developed, well-nourished, no acute distress HEENT: normocephalic; pupils equal and reactive; sclerae  clear; dentition good; mucous membranes moist NECK:  No palpable masses in the thyroid bed; symmetric on extension; no palpable anterior or posterior cervical lymphadenopathy; no supraclavicular masses; no tenderness CHEST: clear to auscultation bilaterally without rales, rhonchi, or wheezes CARDIAC: regular rate and rhythm without significant murmur; peripheral pulses are full ABDOMEN: soft without distension; bowel sounds present; no mass; no hepatosplenomegaly; no hernia GU:  Normal male genitalia without mass or lesion; right inguinal canal without evidence of hernia with cough and Valsalva; obvious bulge in the left inguinal region; palpation shows a moderate left inguinal hernia which is mildly tender; it is reducible; it prolapses spontaneously. EXT:  non-tender without edema; no deformity NEURO: no gross focal deficits; no sign of tremor   LABORATORY RESULTS: See [facility] (CHL-Epic) for most recent results  RADIOLOGY RESULTS: See [facility] (CHL-Epic) for most recent results  IMPRESSION: Left inguinal hernia, symptomatic, reducible  PLAN: I discussed the above findings at length with the patient and his wife. We reviewed the notes from Dr. Ralene Muskrat office.  I have recommended open left inguinal hernia repair with mesh. We have discussed the risk and benefits of the procedure as well as the option for laparoscopic surgery. We have discussed the potential for recurrence. We discussed the risk of infection. We discussed restrictions on his activities following the procedure. Patient  understands and wishes to proceed with outpatient surgery in the near future.  The risks and benefits of the procedure have been discussed at length with the patient.  The patient understands the proposed procedure, potential alternative treatments, and the course of recovery to be expected.  All of the patient's questions have been answered at this time.  The patient wishes to proceed with  surgery.  Earnstine Regal, MD, [facility]  Primary Care Physician: Nyoka Cowden, MD

## 2013-06-22 NOTE — Patient Instructions (Signed)
[facility]  HERNIA REPAIR POST OP INSTRUCTIONS  Always review your discharge instruction sheet given to you by the facility where your surgery was performed.  1. A  prescription for pain medication may be given to you upon discharge.  Take your pain medication as prescribed.  If narcotic pain medicine is not needed, then you may take acetaminophen (Tylenol) or ibuprofen (Advil) as needed.  2. Take your usually prescribed medications unless otherwise directed.  3. If you need a refill on your pain medication, please contact your pharmacy.  They will contact our office to request authorization. Prescriptions will not be filled after 5 pm daily or on weekends.  4. You should follow a light diet the first 24 hours after arrival home, such as soup and crackers or toast.  Be sure to include plenty of fluids daily.  Resume your normal diet the day after surgery.  5. Most patients will experience some swelling and bruising around the surgical site.  Ice packs and reclining will help.  Swelling and bruising can take several days to resolve.   6. It is common to experience some constipation if taking pain medication after surgery.  Increasing fluid intake and taking a stool softener (such as Colace) will usually help or prevent this problem from occurring.  A mild laxative (Milk of Magnesia or Miralax) should be taken according to package directions if there are no bowel movements after 48 hours.  7. Unless discharge instructions indicate otherwise, you may remove your bandages 24-48 hours after surgery, and you may shower at that time.  You may have steri-strips (small skin tapes) in place directly over the incision.  These strips should be left on the skin for 7-10 days.  If your surgeon used skin glue on the incision, you may shower in 24 hours.  The glue will flake off over the next 2-3 weeks.  Any sutures or staples will be removed at the office during your follow-up  visit.  8. ACTIVITIES:  You may resume regular (light) daily activities beginning the next day-such as daily self-care, walking, climbing stairs-gradually increasing activities as tolerated.  You may have sexual intercourse when it is comfortable.  Refrain from any heavy lifting or straining until approved by your doctor.  You may drive when you are no longer taking prescription pain medication, you can comfortably wear a seatbelt, and you can safely maneuver your car and apply brakes.  9. You should see your doctor in the office for a follow-up appointment approximately 2-3 weeks after your surgery.  Make sure that you call for this appointment within a day or two after you arrive home to insure a convenient appointment time. 10.   WHEN TO CALL YOUR DOCTOR: 1. Fever greater than 101.0 2. Inability to urinate 3. Persistent nausea and/or vomiting 4. Extreme swelling or bruising 5. Continued bleeding from incision 6. Increased pain, redness, or drainage from the incision  The clinic staff is available to answer your questions during regular business hours.  Please don't hesitate to call and ask to speak to one of the nurses for clinical concerns.  If you have a medical emergency, go to the nearest emergency room or call 911.  A surgeon from [facility] is always on call for the hospital.   [facility] [address], [address], [place], [place]  [address]  [phone] ? [phone] ? FAX [phone]  [email or web address]   

## 2013-07-07 DIAGNOSIS — L219 Seborrheic dermatitis, unspecified: Secondary | ICD-10-CM | POA: Diagnosis not present

## 2013-07-07 DIAGNOSIS — D239 Other benign neoplasm of skin, unspecified: Secondary | ICD-10-CM | POA: Diagnosis not present

## 2013-07-07 DIAGNOSIS — Z85828 Personal history of other malignant neoplasm of skin: Secondary | ICD-10-CM | POA: Diagnosis not present

## 2013-07-07 DIAGNOSIS — L719 Rosacea, unspecified: Secondary | ICD-10-CM | POA: Diagnosis not present

## 2013-07-07 DIAGNOSIS — L570 Actinic keratosis: Secondary | ICD-10-CM | POA: Diagnosis not present

## 2013-07-07 DIAGNOSIS — L821 Other seborrheic keratosis: Secondary | ICD-10-CM | POA: Diagnosis not present

## 2013-07-14 ENCOUNTER — Other Ambulatory Visit (INDEPENDENT_AMBULATORY_CARE_PROVIDER_SITE_OTHER): Payer: Self-pay

## 2013-07-14 DIAGNOSIS — K4090 Unilateral inguinal hernia, without obstruction or gangrene, not specified as recurrent: Secondary | ICD-10-CM | POA: Diagnosis not present

## 2013-07-14 MED ORDER — HYDROCODONE-ACETAMINOPHEN 5-325 MG PO TABS: 1.0000 | ORAL_TABLET | ORAL | Status: DC | PRN

## 2013-07-27 ENCOUNTER — Telehealth (INDEPENDENT_AMBULATORY_CARE_PROVIDER_SITE_OTHER): Payer: Self-pay | Admitting: General Surgery

## 2013-07-27 NOTE — Telephone Encounter (Signed)
Pt called for advice regarding incision from recent hernia repair.  It appears slightly more pinkish and sore than it has up to now.  Recommended he apply ice pack for comfort and take ibuprofen for the anti-inflammatory effects.  Follow up next Tuesday as scheduled with MD.  He understands and will comply.

## 2013-08-02 ENCOUNTER — Encounter (INDEPENDENT_AMBULATORY_CARE_PROVIDER_SITE_OTHER): Payer: Self-pay | Admitting: Surgery

## 2013-08-02 ENCOUNTER — Ambulatory Visit (INDEPENDENT_AMBULATORY_CARE_PROVIDER_SITE_OTHER): Payer: Medicare Other | Admitting: Surgery

## 2013-08-02 VITALS — BP 126/76 | HR 65 | Temp 98.0°F | Ht 71.0 in | Wt 189.0 lb

## 2013-08-02 DIAGNOSIS — K4090 Unilateral inguinal hernia, without obstruction or gangrene, not specified as recurrent: Secondary | ICD-10-CM

## 2013-08-02 NOTE — Progress Notes (Signed)
General Surgery [facility]  Chief Complaint  Patient presents with  . Routine Post Op    LIH repair 07/14/2013    HISTORY: Patient returns for postoperative visit after left inguinal hernia repair with mesh. Minor discomfort. Anxious to return to activities.  EXAM: Surgical wound is healing nicely without signs of infection. Patient in the inguinal canal with cough and Valsalva shows no sign of recurrence.  IMPRESSION: Status post left inguinal hernia repair with mesh  PLAN: Patient will begin applying topical creams to his incision. He is released to moderate physical activity. He will return to see me for a final wound check in 6 weeks.  Earnstine Regal, MD, [facility]   Visit Diagnoses: 1. Inguinal hernia, left

## 2013-08-02 NOTE — Patient Instructions (Signed)
  CARE OF INCISION   Apply cocoa butter/vitamin E cream (Palmer's brand) to your incision 2 - 3 times daily.  Massage cream into incision for one minute with each application.  Use sunscreen (50 SPF or higher) for first 6 months after surgery if area is exposed to sun.  You may alternate Mederma or other scar reducing cream with cocoa butter cream if desired.       Gaylynn Seiple M. Jonica Bickhart, MD, FACS      [facility]      Office: [phone]    

## 2013-09-13 ENCOUNTER — Telehealth: Payer: Self-pay | Admitting: *Deleted

## 2013-09-13 NOTE — Telephone Encounter (Signed)
Pt called back, told him I am calling regarding My chart message about symptoms are not improved with medication prescribed and he should make an appt to be re-evaluated again. Pt verbalized understanding and stated will think about it and call back. Told him okay.

## 2013-09-13 NOTE — Telephone Encounter (Signed)
Left message on voicemail to call office.  

## 2013-09-14 ENCOUNTER — Encounter (INDEPENDENT_AMBULATORY_CARE_PROVIDER_SITE_OTHER): Payer: Medicare Other | Admitting: Surgery

## 2013-09-16 ENCOUNTER — Encounter (INDEPENDENT_AMBULATORY_CARE_PROVIDER_SITE_OTHER): Payer: Self-pay | Admitting: Surgery

## 2013-10-13 DIAGNOSIS — Z23 Encounter for immunization: Secondary | ICD-10-CM | POA: Diagnosis not present

## 2013-11-14 DIAGNOSIS — E291 Testicular hypofunction: Secondary | ICD-10-CM | POA: Diagnosis not present

## 2013-11-16 DIAGNOSIS — N400 Enlarged prostate without lower urinary tract symptoms: Secondary | ICD-10-CM | POA: Diagnosis not present

## 2013-11-16 DIAGNOSIS — E291 Testicular hypofunction: Secondary | ICD-10-CM | POA: Diagnosis not present

## 2013-11-16 DIAGNOSIS — N5201 Erectile dysfunction due to arterial insufficiency: Secondary | ICD-10-CM | POA: Diagnosis not present

## 2014-07-03 DIAGNOSIS — E291 Testicular hypofunction: Secondary | ICD-10-CM | POA: Diagnosis not present

## 2014-07-03 DIAGNOSIS — N400 Enlarged prostate without lower urinary tract symptoms: Secondary | ICD-10-CM | POA: Diagnosis not present

## 2014-07-05 DIAGNOSIS — E291 Testicular hypofunction: Secondary | ICD-10-CM | POA: Diagnosis not present

## 2014-07-05 DIAGNOSIS — N400 Enlarged prostate without lower urinary tract symptoms: Secondary | ICD-10-CM | POA: Diagnosis not present

## 2014-07-05 DIAGNOSIS — N5201 Erectile dysfunction due to arterial insufficiency: Secondary | ICD-10-CM | POA: Diagnosis not present

## 2014-07-31 DIAGNOSIS — D225 Melanocytic nevi of trunk: Secondary | ICD-10-CM | POA: Diagnosis not present

## 2014-07-31 DIAGNOSIS — Z86018 Personal history of other benign neoplasm: Secondary | ICD-10-CM | POA: Diagnosis not present

## 2014-07-31 DIAGNOSIS — L821 Other seborrheic keratosis: Secondary | ICD-10-CM | POA: Diagnosis not present

## 2014-07-31 DIAGNOSIS — Z85828 Personal history of other malignant neoplasm of skin: Secondary | ICD-10-CM | POA: Diagnosis not present

## 2014-07-31 DIAGNOSIS — L719 Rosacea, unspecified: Secondary | ICD-10-CM | POA: Diagnosis not present

## 2014-11-02 ENCOUNTER — Ambulatory Visit: Payer: Medicare Other

## 2014-11-07 ENCOUNTER — Ambulatory Visit: Payer: Medicare Other

## 2014-11-28 ENCOUNTER — Ambulatory Visit (INDEPENDENT_AMBULATORY_CARE_PROVIDER_SITE_OTHER): Payer: Medicare Other

## 2014-11-28 DIAGNOSIS — Z23 Encounter for immunization: Secondary | ICD-10-CM | POA: Diagnosis not present

## 2015-01-03 ENCOUNTER — Encounter: Payer: Self-pay | Admitting: *Deleted

## 2015-01-18 DIAGNOSIS — E291 Testicular hypofunction: Secondary | ICD-10-CM | POA: Diagnosis not present

## 2015-01-19 DIAGNOSIS — N400 Enlarged prostate without lower urinary tract symptoms: Secondary | ICD-10-CM | POA: Diagnosis not present

## 2015-01-19 DIAGNOSIS — E291 Testicular hypofunction: Secondary | ICD-10-CM | POA: Diagnosis not present

## 2015-01-24 DIAGNOSIS — E291 Testicular hypofunction: Secondary | ICD-10-CM | POA: Diagnosis not present

## 2015-01-24 DIAGNOSIS — N5201 Erectile dysfunction due to arterial insufficiency: Secondary | ICD-10-CM | POA: Diagnosis not present

## 2015-01-24 DIAGNOSIS — Z0000 Encounter for general adult medical examination without abnormal findings: Secondary | ICD-10-CM | POA: Diagnosis not present

## 2015-01-25 DIAGNOSIS — H35363 Drusen (degenerative) of macula, bilateral: Secondary | ICD-10-CM | POA: Diagnosis not present

## 2015-01-25 DIAGNOSIS — H25013 Cortical age-related cataract, bilateral: Secondary | ICD-10-CM | POA: Diagnosis not present

## 2015-01-25 DIAGNOSIS — H1852 Epithelial (juvenile) corneal dystrophy: Secondary | ICD-10-CM | POA: Diagnosis not present

## 2015-01-25 DIAGNOSIS — H2513 Age-related nuclear cataract, bilateral: Secondary | ICD-10-CM | POA: Diagnosis not present

## 2015-02-26 ENCOUNTER — Ambulatory Visit (INDEPENDENT_AMBULATORY_CARE_PROVIDER_SITE_OTHER): Payer: Medicare Other | Admitting: Internal Medicine

## 2015-02-26 ENCOUNTER — Encounter: Payer: Self-pay | Admitting: Internal Medicine

## 2015-02-26 VITALS — BP 110/70 | HR 77 | Temp 98.0°F | Resp 20 | Ht 70.0 in | Wt 184.0 lb

## 2015-02-26 DIAGNOSIS — Z7289 Other problems related to lifestyle: Secondary | ICD-10-CM | POA: Diagnosis not present

## 2015-02-26 DIAGNOSIS — Z0000 Encounter for general adult medical examination without abnormal findings: Secondary | ICD-10-CM

## 2015-02-26 DIAGNOSIS — Z23 Encounter for immunization: Secondary | ICD-10-CM

## 2015-02-26 DIAGNOSIS — E349 Endocrine disorder, unspecified: Secondary | ICD-10-CM

## 2015-02-26 DIAGNOSIS — E291 Testicular hypofunction: Secondary | ICD-10-CM | POA: Diagnosis not present

## 2015-02-26 DIAGNOSIS — E785 Hyperlipidemia, unspecified: Secondary | ICD-10-CM | POA: Diagnosis not present

## 2015-02-26 LAB — CBC WITH DIFFERENTIAL/PLATELET
BASOS ABS: 0 10*3/uL (ref 0.0–0.1)
Basophils Relative: 0.6 % (ref 0.0–3.0)
EOS ABS: 0.1 10*3/uL (ref 0.0–0.7)
EOS PCT: 1.1 % (ref 0.0–5.0)
HCT: 46.3 % (ref 39.0–52.0)
Hemoglobin: 15.4 g/dL (ref 13.0–17.0)
Lymphocytes Relative: 29.4 % (ref 12.0–46.0)
Lymphs Abs: 1.8 10*3/uL (ref 0.7–4.0)
MCHC: 33.3 g/dL (ref 30.0–36.0)
MCV: 92.3 fl (ref 78.0–100.0)
MONO ABS: 0.6 10*3/uL (ref 0.1–1.0)
Monocytes Relative: 8.9 % (ref 3.0–12.0)
NEUTROS PCT: 60 % (ref 43.0–77.0)
Neutro Abs: 3.8 10*3/uL (ref 1.4–7.7)
Platelets: 279 10*3/uL (ref 150.0–400.0)
RBC: 5.01 Mil/uL (ref 4.22–5.81)
RDW: 13.7 % (ref 11.5–15.5)
WBC: 6.3 10*3/uL (ref 4.0–10.5)

## 2015-02-26 LAB — COMPREHENSIVE METABOLIC PANEL
ALBUMIN: 4.3 g/dL (ref 3.5–5.2)
ALK PHOS: 48 U/L (ref 39–117)
ALT: 15 U/L (ref 0–53)
AST: 16 U/L (ref 0–37)
BILIRUBIN TOTAL: 0.7 mg/dL (ref 0.2–1.2)
BUN: 24 mg/dL — AB (ref 6–23)
CO2: 29 mEq/L (ref 19–32)
CREATININE: 0.98 mg/dL (ref 0.40–1.50)
Calcium: 9.5 mg/dL (ref 8.4–10.5)
Chloride: 105 mEq/L (ref 96–112)
GFR: 80.21 mL/min (ref >60.00)
GLUCOSE: 104 mg/dL — AB (ref 70–99)
Potassium: 5.2 mEq/L — ABNORMAL HIGH (ref 3.5–5.1)
SODIUM: 141 meq/L (ref 135–145)
TOTAL PROTEIN: 6.7 g/dL (ref 6.0–8.3)

## 2015-02-26 LAB — LIPID PANEL
CHOL/HDL RATIO: 3
Cholesterol: 157 mg/dL (ref 0–200)
HDL: 55.6 mg/dL (ref >39.00)
LDL CALC: 85 mg/dL (ref 0–99)
NONHDL: 101.55
Triglycerides: 82 mg/dL (ref 0.0–149.0)
VLDL: 16.4 mg/dL (ref 0.0–40.0)

## 2015-02-26 LAB — HEPATITIS C ANTIBODY: HCV AB: NEGATIVE

## 2015-02-26 LAB — TSH: TSH: 1.77 u[IU]/mL (ref 0.35–4.50)

## 2015-02-26 NOTE — Progress Notes (Signed)
Subjective:    Patient ID: Donald Montgomery, male    DOB: [birth year], 70 y.o.   MRN: [id number]  HPI    History of Present Illness:   70 year-old patient who is seen today for a comprehensive evaluation. He enjoys excellent health.He is followed by urology for ED and testosterone deficiency   Here for Medicare AWV:   1. Risk factors based on Past M, S, F history: no cardiovascular risk factors  2. Physical Activities: walks almost daily  3. Depression/mood: no history of depression or mood disorder  4. Hearing: no deficits  5. ADL's: independent in all aspects of daily living  6. Fall Risk: low  7. Home Safety: no cause identified  8. Height, weight, &visual acuity:height and weight stable. No difficulty with visual acuity  9. Counseling: regular exercise, heart healthy diet. All encouraged  10. Labs ordered based on risk factors: laboratory profile, including lipid panel will be reviewed  11. Referral Coordination- and appropriate at this time. Follow up urology in 6 months  12. Care Plan- continue present testosterone replacement therapy  13. Cognitive Assessment- alert and oriented, with normal affect. No cognitive dysfunction. Memory intact, and able to handle all executive functions without difficulty. Still works part-time as an Pensions consultant.  Preventive services will include annual exams.  We'll continue colonoscopies at 10 year intervals.  He is seen by urology twice annually.  Annual eye examinations.  Encouraged 15.  Provider list includes ophthalmology primary care urology  Allergies (verified):  No Known Drug Allergies   Past History:  Past Medical History:   history of basal cell cancer  ED  androgen deficiency   Past Surgical History:  Tonsillectomy  colonoscopy [facility] 2010)  Status post left inguinal hernia repair 2015  Family History:   father died age 50 of lung cancer, history of coronary artery disease  mother died at age 67; history of  dementia  one brother died at age 50 apparent MI  one older brother  died of a cerebral hemorrhage at age 82  Social History:   Married  walks 4 miles [place]     Review of Systems  Constitutional: Negative for fever, chills, activity change, appetite change and fatigue.  HENT: Positive for postnasal drip and rhinorrhea. Negative for congestion, dental problem, ear pain, hearing loss, mouth sores, sinus pressure, sneezing, tinnitus, trouble swallowing and voice change.   Eyes: Negative for photophobia, pain, redness and visual disturbance.  Respiratory: Negative for apnea, cough, choking, chest tightness, shortness of breath and wheezing.   Cardiovascular: Negative for chest pain, palpitations and leg swelling.  Gastrointestinal: Negative for nausea, vomiting, abdominal pain, diarrhea, constipation, blood in stool, abdominal distention, anal bleeding and rectal pain.  Genitourinary: Negative for dysuria, urgency, frequency, hematuria, flank pain, decreased urine volume, discharge, penile swelling, scrotal swelling, difficulty urinating, genital sores and testicular pain.  Musculoskeletal: Negative for myalgias, back pain, joint swelling, arthralgias, gait problem, neck pain and neck stiffness.  Skin: Negative for color change, rash and wound.  Neurological: Negative for dizziness, tremors, seizures, syncope, facial asymmetry, speech difficulty, weakness, light-headedness, numbness and headaches.  Hematological: Negative for adenopathy. Does not bruise/bleed easily.  Psychiatric/Behavioral: Negative for suicidal ideas, hallucinations, behavioral problems, confusion, sleep disturbance, self-injury, dysphoric mood, decreased concentration and agitation. The patient is not nervous/anxious.        Objective:   Physical Exam  Constitutional: He appears well-developed and well-nourished.  HENT:  Head: Normocephalic and atraumatic.  Right  Ear: External ear normal.  Left Ear:  External ear normal.  Nose: Nose normal.  Mouth/Throat: Oropharynx is clear and moist.  Eyes: Conjunctivae and EOM are normal. Pupils are equal, round, and reactive to light. No scleral icterus.  Neck: Normal range of motion. Neck supple. No JVD present. No thyromegaly present.  Cardiovascular: Regular rhythm, normal heart sounds and intact distal pulses.  Exam reveals no gallop and no friction rub.   No murmur heard. Pulmonary/Chest: Effort normal and breath sounds normal. He exhibits no tenderness.  Abdominal: Soft. Bowel sounds are normal. He exhibits no distension and no mass. There is no tenderness.  Surgical scar left lower quadrant  Genitourinary: Penis normal.  Musculoskeletal: Normal range of motion. He exhibits no edema or tenderness.  Lymphadenopathy:    He has no cervical adenopathy.  Neurological: He is alert. He has normal reflexes. No cranial nerve deficit. Coordination normal.  Skin: Skin is warm and dry. No rash noted.  Psychiatric: He has a normal mood and affect. His behavior is normal.          Assessment & Plan:   Preventive health exam Testosterone deficiency. Followup urology  Return here in one year or as needed We'll check screening lab  Return in one year or as needed

## 2015-02-26 NOTE — Patient Instructions (Addendum)
It is important that you exercise regularly, at least 20 minutes 3 to 4 times per week.  If you develop chest pain or shortness of breath seek  medical attention.  Return in one year for follow-up  Health Maintenance, Male A healthy lifestyle and preventative care can promote health and wellness.  Maintain regular health, dental, and eye exams.  Eat a healthy diet. Foods like vegetables, fruits, whole grains, low-fat dairy products, and lean protein foods contain the nutrients you need and are low in calories. Decrease your intake of foods high in solid fats, added sugars, and salt. Get information about a proper diet from your health care provider, if necessary.  Regular physical exercise is one of the most important things you can do for your health. Most adults should get at least 150 minutes of moderate-intensity exercise (any activity that increases your heart rate and causes you to sweat) each week. In addition, most adults need muscle-strengthening exercises on 2 or more days a week.   Maintain a healthy weight. The body mass index (BMI) is a screening tool to identify possible weight problems. It provides an estimate of body fat based on height and weight. Your health care provider can find your BMI and can help you achieve or maintain a healthy weight. For males 20 years and older:  A BMI below 18.5 is considered underweight.  A BMI of 18.5 to 24.9 is normal.  A BMI of 25 to 29.9 is considered overweight.  A BMI of 30 and above is considered obese.  Maintain normal blood lipids and cholesterol by exercising and minimizing your intake of saturated fat. Eat a balanced diet with plenty of fruits and vegetables. Blood tests for lipids and cholesterol should begin at age 53 and be repeated every 5 years. If your lipid or cholesterol levels are high, you are over age 63, or you are at high risk for heart disease, you may need your cholesterol levels checked more frequently.Ongoing high  lipid and cholesterol levels should be treated with medicines if diet and exercise are not working.  If you smoke, find out from your health care provider how to quit. If you do not use tobacco, do not start.  Lung cancer screening is recommended for adults aged 89-80 years who are at high risk for developing lung cancer because of a history of smoking. A yearly low-dose CT scan of the lungs is recommended for people who have at least a 30-pack-year history of smoking and are current smokers or have quit within the past 15 years. A pack year of smoking is smoking an average of 1 pack of cigarettes a day for 1 year (for example, a 30-pack-year history of smoking could mean smoking 1 pack a day for 30 years or 2 packs a day for 15 years). Yearly screening should continue until the smoker has stopped smoking for at least 15 years. Yearly screening should be stopped for people who develop a health problem that would prevent them from having lung cancer treatment.  If you choose to drink alcohol, do not have more than 2 drinks per day. One drink is considered to be 12 oz (360 mL) of beer, 5 oz (150 mL) of wine, or 1.5 oz (45 mL) of liquor.  Avoid the use of street drugs. Do not share needles with anyone. Ask for help if you need support or instructions about stopping the use of drugs.  High blood pressure causes heart disease and increases the risk of stroke.  High blood pressure is more likely to develop in:  People who have blood pressure in the end of the normal range (100-139/85-89 mm Hg).  People who are overweight or obese.  People who are African American.  If you are 59-32 years of age, have your blood pressure checked every 3-5 years. If you are 106 years of age or older, have your blood pressure checked every year. You should have your blood pressure measured twice--once when you are at a hospital or clinic, and once when you are not at a hospital or clinic. Record the average of the two  measurements. To check your blood pressure when you are not at a hospital or clinic, you can use:  An automated blood pressure machine at a pharmacy.  A home blood pressure monitor.  If you are 65-41 years old, ask your health care provider if you should take aspirin to prevent heart disease.  Diabetes screening involves taking a blood sample to check your fasting blood sugar level. This should be done once every 3 years after age 11 if you are at a normal weight and without risk factors for diabetes. Testing should be considered at a younger age or be carried out more frequently if you are overweight and have at least 1 risk factor for diabetes.  Colorectal cancer can be detected and often prevented. Most routine colorectal cancer screening begins at the age of 17 and continues through age 2. However, your health care provider may recommend screening at an earlier age if you have risk factors for colon cancer. On a yearly basis, your health care provider may provide home test kits to check for hidden blood in the stool. A small camera at the end of a tube may be used to directly examine the colon (sigmoidoscopy or colonoscopy) to detect the earliest forms of colorectal cancer. Talk to your health care provider about this at age 42 when routine screening begins. A direct exam of the colon should be repeated every 5-10 years through age 8, unless early forms of precancerous polyps or small growths are found.  People who are at an increased risk for hepatitis B should be screened for this virus. You are considered at high risk for hepatitis B if:  You were born in a country where hepatitis B occurs often. Talk with your health care provider about which countries are considered high risk.  Your parents were born in a high-risk country and you have not received a shot to protect against hepatitis B (hepatitis B vaccine).  You have HIV or AIDS.  You use needles to inject street drugs.  You live  with, or have sex with, someone who has hepatitis B.  You are a man who has sex with other men (MSM).  You get hemodialysis treatment.  You take certain medicines for conditions like cancer, organ transplantation, and autoimmune conditions.  Hepatitis C blood testing is recommended for all people born from [birth year] through 1966 and any individual with known risk factors for hepatitis C.  Healthy men should no longer receive prostate-specific antigen (PSA) blood tests as part of routine cancer screening. Talk to your health care provider about prostate cancer screening.  Testicular cancer screening is not recommended for adolescents or adult males who have no symptoms. Screening includes self-exam, a health care provider exam, and other screening tests. Consult with your health care provider about any symptoms you have or any concerns you have about testicular cancer.  Practice safe sex. Use condoms  and avoid high-risk sexual practices to reduce the spread of sexually transmitted infections (STIs).  You should be screened for STIs, including gonorrhea and chlamydia if:  You are sexually active and are younger than 24 years.  You are older than 24 years, and your health care provider tells you that you are at risk for this type of infection.  Your sexual activity has changed since you were last screened, and you are at an increased risk for chlamydia or gonorrhea. Ask your health care provider if you are at risk.  If you are at risk of being infected with HIV, it is recommended that you take a prescription medicine daily to prevent HIV infection. This is called pre-exposure prophylaxis (PrEP). You are considered at risk if:  You are a man who has sex with other men (MSM).  You are a heterosexual man who is sexually active with multiple partners.  You take drugs by injection.  You are sexually active with a partner who has HIV.  Talk with your health care provider about whether you are at  high risk of being infected with HIV. If you choose to begin PrEP, you should first be tested for HIV. You should then be tested every 3 months for as long as you are taking PrEP.  Use sunscreen. Apply sunscreen liberally and repeatedly throughout the day. You should seek shade when your shadow is shorter than you. Protect yourself by wearing long sleeves, pants, a wide-brimmed hat, and sunglasses year round whenever you are outdoors.  Tell your health care provider of new moles or changes in moles, especially if there is a change in shape or color. Also, tell your health care provider if a mole is larger than the size of a pencil eraser.  A one-time screening for abdominal aortic aneurysm (AAA) and surgical repair of large AAAs by ultrasound is recommended for men aged 64-75 years who are current or former smokers.  Stay current with your vaccines (immunizations).   This information is not intended to replace advice given to you by your health care provider. Make sure you discuss any questions you have with your health care provider.   Document Released: 06/21/2007 Document Revised: 01/13/2014 Document Reviewed: 05/20/2010 Elsevier Interactive Patient Education [employer].

## 2015-02-26 NOTE — Progress Notes (Signed)
Pre visit review using our clinic review tool, if applicable. No additional management support is needed unless otherwise documented below in the visit note. 

## 2015-08-03 DIAGNOSIS — E291 Testicular hypofunction: Secondary | ICD-10-CM | POA: Diagnosis not present

## 2015-08-06 DIAGNOSIS — Z86018 Personal history of other benign neoplasm: Secondary | ICD-10-CM | POA: Diagnosis not present

## 2015-08-06 DIAGNOSIS — L821 Other seborrheic keratosis: Secondary | ICD-10-CM | POA: Diagnosis not present

## 2015-08-06 DIAGNOSIS — C44319 Basal cell carcinoma of skin of other parts of face: Secondary | ICD-10-CM | POA: Diagnosis not present

## 2015-08-06 DIAGNOSIS — D225 Melanocytic nevi of trunk: Secondary | ICD-10-CM | POA: Diagnosis not present

## 2015-08-06 DIAGNOSIS — L719 Rosacea, unspecified: Secondary | ICD-10-CM | POA: Diagnosis not present

## 2015-08-06 DIAGNOSIS — Z85828 Personal history of other malignant neoplasm of skin: Secondary | ICD-10-CM | POA: Diagnosis not present

## 2015-08-06 DIAGNOSIS — D485 Neoplasm of uncertain behavior of skin: Secondary | ICD-10-CM | POA: Diagnosis not present

## 2015-08-08 DIAGNOSIS — N5201 Erectile dysfunction due to arterial insufficiency: Secondary | ICD-10-CM | POA: Diagnosis not present

## 2015-08-08 DIAGNOSIS — E291 Testicular hypofunction: Secondary | ICD-10-CM | POA: Diagnosis not present

## 2015-10-03 DIAGNOSIS — C44319 Basal cell carcinoma of skin of other parts of face: Secondary | ICD-10-CM | POA: Diagnosis not present

## 2015-10-10 DIAGNOSIS — Z23 Encounter for immunization: Secondary | ICD-10-CM | POA: Diagnosis not present

## 2016-01-28 ENCOUNTER — Encounter: Payer: Self-pay | Admitting: Internal Medicine

## 2016-01-28 DIAGNOSIS — H35363 Drusen (degenerative) of macula, bilateral: Secondary | ICD-10-CM | POA: Diagnosis not present

## 2016-01-28 DIAGNOSIS — H25013 Cortical age-related cataract, bilateral: Secondary | ICD-10-CM | POA: Diagnosis not present

## 2016-01-28 DIAGNOSIS — H1852 Epithelial (juvenile) corneal dystrophy: Secondary | ICD-10-CM | POA: Diagnosis not present

## 2016-01-28 DIAGNOSIS — H2513 Age-related nuclear cataract, bilateral: Secondary | ICD-10-CM | POA: Diagnosis not present

## 2016-02-01 DIAGNOSIS — Z125 Encounter for screening for malignant neoplasm of prostate: Secondary | ICD-10-CM | POA: Diagnosis not present

## 2016-02-01 DIAGNOSIS — E291 Testicular hypofunction: Secondary | ICD-10-CM | POA: Diagnosis not present

## 2016-02-11 DIAGNOSIS — N400 Enlarged prostate without lower urinary tract symptoms: Secondary | ICD-10-CM | POA: Diagnosis not present

## 2016-02-11 DIAGNOSIS — N5201 Erectile dysfunction due to arterial insufficiency: Secondary | ICD-10-CM | POA: Diagnosis not present

## 2016-02-11 DIAGNOSIS — E291 Testicular hypofunction: Secondary | ICD-10-CM | POA: Diagnosis not present

## 2016-03-04 ENCOUNTER — Ambulatory Visit (INDEPENDENT_AMBULATORY_CARE_PROVIDER_SITE_OTHER): Payer: Medicare Other

## 2016-03-04 ENCOUNTER — Telehealth: Payer: Self-pay

## 2016-03-04 VITALS — BP 108/70 | HR 89 | Ht 70.0 in | Wt 189.2 lb

## 2016-03-04 DIAGNOSIS — Z0000 Encounter for general adult medical examination without abnormal findings: Secondary | ICD-10-CM

## 2016-03-04 DIAGNOSIS — Z87891 Personal history of nicotine dependence: Secondary | ICD-10-CM | POA: Diagnosis not present

## 2016-03-04 NOTE — Progress Notes (Addendum)
Subjective:   Donald Montgomery is a 71 y.o. male who presents for Medicare Annual/Subsequent preventive examination.  The Patient was informed that the wellness visit is to identify future health risk and educate and initiate measures that can reduce risk for increased disease through the lifespan.    NO ROS; Medicare Wellness Visit  Current issues:  Has a problem with clearing his throat/ seen Dr. Raliegh Ip last year Encouraged to make another apt but requested a referral to  [facility] [place] ; Dr. Ronna Polio or other  Will send not to Dr. Raliegh Ip. Tried nasal steroid; and this did not work  Still having the same issues    Foot issues Plantar fascitis in right foot; considered  when he started wearing a different pair of shoes; got arch supports; is some better now  Dental; Dr. Wille Glaser and Wille Glaser;  Dental apt x 2 months ago; states he is having an issue with one tooth and encouraged him to go back to the dentist   Describes health as good, fair or great? Very good   Preventive Screening -Counseling & Management  PSA 11/2011 0.74 -  Colonoscopy; per patient report; completed at [facility] 11/2008 and put in epic for this date;   Smoking history - former smoker/ has been 30 years   30 pack hx: Educated regarding LDCT if applicable with a 30 year hx Also educated regarding AAA for male tobacco users with smoking hx  Agreed to AAA check and ordered today Smokeless tobacco no Second Hand Smoke status; No Smokers in the home no ETOH - no  Medication adherence or issues? 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[employer] Slowed down over the last 3 years and no longer working    [place]; eggs, muffin; fruit Lunch; sandwich Dinner cooked meal at home No beef and very little pork Eats Vegetarian meals; son and spouse are vegetarian Does not go out to eat very much   Regular exercise still walks 1 hour per  day and 3.5 miles to 4 miles  Cardiac Risk Factors:  Advanced aged > 52 in men; Hyperlipidemia - chol/dl 3; hdl 41; Trig 82  Diabetes -neg Family History mother had HD OT NOTE  One brother died at 75 or 97 from MI; no known symptoms Other brother had stroke x  68 o 67 yo at 46 and died, no know symptoms  Fall risk ; no   Climb once in [place];  Given education on "Fall Prevention in the Home" for more safety tips the patient can apply as appropriate.  Long term goal is to "age in place"  No intent to move at present   Mobility of Functional changes this year? No issues  Living in home children were raised; 2 story;  Can put a bedroom downstairs if needed  No issues or plans to move now Have one son  One grand dtr and she is 40 and stays with them a couple of days  Family is vegetarian  Safety; community, wears sunscreen, safe place for firearms; Motor vehicle accidents; no  Mental Health:  Any emotional problems? Anxious, depressed, irritable, sad or blue? No Denies feeling depressed or hopeless; voices pleasure in daily life How many social activities have you been engaged in within the last 2 weeks? no   Hearing Screening Comments: 4000hz  of both ears Vision Screening Comments: Vision check 2 to 3 months ago Seen  Dr. Pandora Leiter  strabismius but used to it   Activities of Daily Living - See functional screen   Cognitive testing; Ad8 score; 0 or less than 2  MMSE deferred or completed if AD8 + 2 issues  Advanced Directives completed   Patient Care Team: Marletta Lor, MD as PCP - General Irine Seal, MD as Attending Physician (Urology)    Immunization History  Administered Date(s) Administered  . Influenza Whole 10/10/2009  . Influenza, High Dose Seasonal PF 11/28/2014, 10/10/2015  . Pneumococcal Conjugate-13 02/26/2015  . Pneumococcal Polysaccharide-23 12/01/2011  . Tdap 01/09/2012  . Zoster 01/09/2012   Required Immunizations needed today  Screening  test up to date or reviewed for plan of completion Health Maintenance Due  Topic Date Due  . COLONOSCOPY  06/26/1994   [facility] 11.2010; does now know if they will repeat Updated epic with the patient's self report, as well as date in [place]   Cardiac Risk Factors include: advanced age (>54men, >24 women);family history of premature cardiovascular disease;male gender     Objective:    Vitals: BP 108/70   Pulse 89   Ht 5\' 10"  (1.778 m)   Wt 189 lb 4 oz (85.8 kg)   SpO2 93%   BMI 27.15 kg/m   Body mass index is 27.15 kg/m.  Tobacco History  Smoking Status  . Former Smoker  . Types: Cigarettes, Pipe  Smokeless Tobacco  . Never Used    Comment: will consider AAA check     Counseling given: Yes   Past Medical History:  Diagnosis Date  . Cancer ([place])    No past surgical history on file. Family History  Problem Relation Age of Onset  . Heart disease Mother   . Heart disease Brother    History  Sexual Activity  . Sexual activity: Not on file    Outpatient Encounter Prescriptions as of 03/04/2016  Medication Sig  . ANDROGEL PUMP 20.25 MG/ACT (1.62%) GEL Apply 4 application topically daily.   . cholecalciferol (VITAMIN D) 1000 units tablet Take 2,000 Units by mouth daily.  . Coenzyme Q10 (CO Q 10 PO) Take 1 capsule by mouth daily.  . magnesium gluconate (MAGONATE) 500 MG tablet Take 500 mg by mouth daily.  . Omega-3 Fatty Acids (FISH OIL) 1000 MG CAPS Take 1 capsule by mouth daily.  . tadalafil (CIALIS) 5 MG tablet Take 2.5 mg by mouth daily as needed for erectile dysfunction.   No facility-administered encounter medications on file as of 03/04/2016.     Activities of Daily Living In your present state of health, do you have any difficulty performing the following activities: 03/04/2016  Hearing? N  Vision? N  Difficulty concentrating or making decisions? (No Data)  Walking or climbing stairs? N  Dressing or bathing? N  Doing errands, shopping? N  Preparing Food and  eating ? N  Using the Toilet? N  In the past six months, have you accidently leaked urine? N  Do you have problems with loss of bowel control? N  Managing your Medications? N  Managing your Finances? N  Housekeeping or managing your Housekeeping? N  Some recent data might be hidden    Patient Care Team: Marletta Lor, MD as PCP - General Irine Seal, MD as Attending Physician (Urology)   Assessment:     Exercise Activities and Dietary recommendations Current Exercise Habits: Home exercise routine, Time (Minutes): 60, Frequency (Times/Week): 7, Weekly Exercise (Minutes/Week): 420, Intensity: Moderate  Goals    . to  maintain          Lift weight a couple of days a week       Fall Risk Fall Risk  03/04/2016 02/26/2015  Falls in the past year? No No   Depression Screen PHQ 2/9 Scores 03/04/2016 02/26/2015  PHQ - 2 Score 0 0    Cognitive Function MMSE - Mini Mental State Exam 03/04/2016  Not completed: (No Data)     Ad8 score 0     Immunization History  Administered Date(s) Administered  . Influenza Whole 10/10/2009  . Influenza, High Dose Seasonal PF 11/28/2014, 10/10/2015  . Pneumococcal Conjugate-13 02/26/2015  . Pneumococcal Polysaccharide-23 12/01/2011  . Tdap 01/09/2012  . Zoster 01/09/2012   Screening Tests Health Maintenance  Topic Date Due  . COLONOSCOPY  06/26/1994  . TETANUS/TDAP  01/08/2022  . INFLUENZA VACCINE  Completed  . Hepatitis C Screening  Completed  . PNA vac Low Risk Adult  Completed      Plan:      PCP Notes  Health Maintenance Agreed to AAA since he smoked more than 100 cigarettes; Quit 30 years ago  Abnormal Screens none  Referrals is requesting a referral to an allergist for persistent throat drainage. Was addressed last year by Dr. Raliegh Ip; Request referral but told him he may have to come in and be seen. Will send Dr. Raliegh Ip a basket note for referral vs apt.  Labs due but stated he is doing well and no new issues for  labs.  Patient concerns; Needs dentist to check tooth. Has some foot pain to the side of right foot; states it is burning but is some better; Again, offered apt but may go to podiatrist   Nurse Concerns;  To note; Stated 2 brothers have died.  One in his 22's due to MI- no s/s The other at 35 due to stroke, no s/s. This was added to family hx. No cp; or dizziness or other noted in the patient  Next PCP apt TBS    During the course of the visit the patient was educated and counseled about the following appropriate screening and preventive services:   Vaccines to include Pneumoccal, Influenza, Hepatitis B, Td, Zostavax, HCV  Electrocardiogram  Cardiovascular Disease  Colorectal cancer screening  Due 2020  Diabetes screening -neg  Prostate Cancer Screening   Glaucoma screening  Nutrition counseling   Smoking cessation counseling  Patient Instructions (the written plan) was given to the patient.    [id number], RN  03/04/2016  Results of Medicare wellness visit reviewed and agree with findings  Nyoka Cowden   Resent to Dr. Yong Channel to sign due to Dr. Raliegh Ip being out of the office 2/27  I have reviewed and agree with note, evaluation, plan. Agree needs to see dentist. Defer decision on referral to Dr. Raliegh Ip and message that has been sent to him. AAA screen good idea.   Garret Reddish, MD

## 2016-03-04 NOTE — Patient Instructions (Addendum)
[name] , Thank you for taking time to come for your Medicare Wellness Visit. I appreciate your ongoing commitment to your health goals. Please review the following plan we discussed and let me know if I can assist you in the future.   Will check on next colonoscopy by [facility]; 2010; most likely will outreach in 2020 Dr. Thera Flake.   Donald Montgomery will see if Dr. Raliegh Ip will refer to allergies for "clearing throat" issues  Will consider podiatrist or MD apt if right side of your right foot continues to bother you.  Check with your dentist  Will order the vascular AAA study due to 100 cigarette smoking hx.  Prevention of falls: Remove rugs or any tripping hazards in the home Use Non slip mats in bathtubs and showers Placing grab bars next to the toilet and or shower Placing handrails on both sides of the stair way Adding extra lighting in the home.   Personal safety issues reviewed:  1. Consider starting a community watch program per [facility] 2.  Changes batteries is smoke detector and/or carbon monoxide detector  3.  If you have firearms; keep them in a safe place 4.  Wear protection when in the sun; Always wear sunscreen or a hat; It is good to have your doctor check your skin annually or review any new areas of concern 5. Driving safety; Keep in the right lane; stay 3 car lengths behind the car in front of you on the highway; look 3 times prior to pulling out; carry your cell phone everywhere you go!    Learn about the Yellow Dot program:  The program allows first responders at your emergency to have access to who your physician is, as well as your medications and medical conditions.  Citizens requesting the Yellow Dot Packages should contact Master Corporal Nunzio Cobbs at the [facility] [phone] for the first week of the program and beginning the week after Easter citizens should contact their Scientist, physiological.   These are the goals we  discussed: Goals    . to maintain          Lift weight a couple of days a week        This is a list of the screening recommended for you and due dates:  Health Maintenance  Topic Date Due  . Colon Cancer Screening  06/26/1994  . Tetanus Vaccine  01/08/2022  . Flu Shot  Completed  .  Hepatitis C: One time screening is recommended by Center for Disease Control  (CDC) for  adults born from [birth year] through 1965.   Completed  . Pneumonia vaccines  Completed        Fall Prevention in the Home Falls can cause injuries. They can happen to people of all ages. There are many things you can do to make your home safe and to help prevent falls. What can I do on the outside of my home?  Regularly fix the edges of walkways and driveways and fix any cracks.  Remove anything that might make you trip as you walk through a door, such as a raised step or threshold.  Trim any bushes or trees on the path to your home.  Use bright outdoor lighting.  Clear any walking paths of anything that might make someone trip, such as rocks or tools.  Regularly check to see if handrails are loose or broken. Make sure that both sides of any steps have handrails.  Any raised  decks and porches should have guardrails on the edges.  Have any leaves, snow, or ice cleared regularly.  Use sand or salt on walking paths during winter.  Clean up any spills in your garage right away. This includes oil or grease spills. What can I do in the bathroom?  Use night lights.  Install grab bars by the toilet and in the tub and shower. Do not use towel bars as grab bars.  Use non-skid mats or decals in the tub or shower.  If you need to sit down in the shower, use a plastic, non-slip stool.  Keep the floor dry. Clean up any water that spills on the floor as soon as it happens.  Remove soap buildup in the tub or shower regularly.  Attach bath mats securely with double-sided non-slip rug tape.  Do not have throw  rugs and other things on the floor that can make you trip. What can I do in the bedroom?  Use night lights.  Make sure that you have a light by your bed that is easy to reach.  Do not use any sheets or blankets that are too big for your bed. They should not hang down onto the floor.  Have a firm chair that has side arms. You can use this for support while you get dressed.  Do not have throw rugs and other things on the floor that can make you trip. What can I do in the kitchen?  Clean up any spills right away.  Avoid walking on wet floors.  Keep items that you use a lot in easy-to-reach places.  If you need to reach something above you, use a strong step stool that has a grab bar.  Keep electrical cords out of the way.  Do not use floor polish or wax that makes floors slippery. If you must use wax, use non-skid floor wax.  Do not have throw rugs and other things on the floor that can make you trip. What can I do with my stairs?  Do not leave any items on the stairs.  Make sure that there are handrails on both sides of the stairs and use them. Fix handrails that are broken or loose. Make sure that handrails are as long as the stairways.  Check any carpeting to make sure that it is firmly attached to the stairs. Fix any carpet that is loose or worn.  Avoid having throw rugs at the top or bottom of the stairs. If you do have throw rugs, attach them to the floor with carpet tape.  Make sure that you have a light switch at the top of the stairs and the bottom of the stairs. If you do not have them, ask someone to add them for you. What else can I do to help prevent falls?  Wear shoes that:  Do not have high heels.  Have rubber bottoms.  Are comfortable and fit you well.  Are closed at the toe. Do not wear sandals.  If you use a stepladder:  Make sure that it is fully opened. Do not climb a closed stepladder.  Make sure that both sides of the stepladder are locked into  place.  Ask someone to hold it for you, if possible.  Clearly mark and make sure that you can see:  Any grab bars or handrails.  First and last steps.  Where the edge of each step is.  Use tools that help you move around (mobility aids) if they are  needed. These include:  Canes.  Walkers.  Scooters.  Crutches.  Turn on the lights when you go into a dark area. Replace any light bulbs as soon as they burn out.  Set up your furniture so you have a clear path. Avoid moving your furniture around.  If any of your floors are uneven, fix them.  If there are any pets around you, be aware of where they are.  Review your medicines with your doctor. Some medicines can make you feel dizzy. This can increase your chance of falling. Ask your doctor what other things that you can do to help prevent falls. This information is not intended to replace advice given to you by your health care provider. Make sure you discuss any questions you have with your health care provider. Document Released: 10/19/2008 Document Revised: 05/31/2015 Document Reviewed: 01/27/2014 Elsevier Interactive Patient Education  2017 [employer] Maintenance, Male A healthy lifestyle and preventive care is important for your health and wellness. Ask your health care provider about what schedule of regular examinations is right for you. What should I know about weight and diet?  Eat a Healthy Diet  Eat plenty of vegetables, fruits, whole grains, low-fat dairy products, and lean protein.  Do not eat a lot of foods high in solid fats, added sugars, or salt. Maintain a Healthy Weight  Regular exercise can help you achieve or maintain a healthy weight. You should:  Do at least 150 minutes of exercise each week. The exercise should increase your heart rate and make you sweat (moderate-intensity exercise).  Do strength-training exercises at least twice a week. Watch Your Levels of Cholesterol and Blood  Lipids  Have your blood tested for lipids and cholesterol every 5 years starting at 72 years of age. If you are at high risk for heart disease, you should start having your blood tested when you are 72 years old. You may need to have your cholesterol levels checked more often if:  Your lipid or cholesterol levels are high.  You are older than 72 years of age.  You are at high risk for heart disease. What should I know about cancer screening? Many types of cancers can be detected early and may often be prevented. Lung Cancer  You should be screened every year for lung cancer if:  You are a current smoker who has smoked for at least 30 years.  You are a former smoker who has quit within the past 15 years.  Talk to your health care provider about your screening options, when you should start screening, and how often you should be screened. Colorectal Cancer  Routine colorectal cancer screening usually begins at 72 years of age and should be repeated every 5-10 years until you are 72 years old. You may need to be screened more often if early forms of precancerous polyps or small growths are found. Your health care provider may recommend screening at an earlier age if you have risk factors for colon cancer.  Your health care provider may recommend using home test kits to check for hidden blood in the stool.  A small camera at the end of a tube can be used to examine your colon (sigmoidoscopy or colonoscopy). This checks for the earliest forms of colorectal cancer. Prostate and Testicular Cancer  Depending on your age and overall health, your health care provider may do certain tests to screen for prostate and testicular cancer.  Talk to your health care provider about any  symptoms or concerns you have about testicular or prostate cancer. Skin Cancer  Check your skin from head to toe regularly.  Tell your health care provider about any new moles or changes in moles, especially  if:  There is a change in a mole's size, shape, or color.  You have a mole that is larger than a pencil eraser.  Always use sunscreen. Apply sunscreen liberally and repeat throughout the day.  Protect yourself by wearing long sleeves, pants, a wide-brimmed hat, and sunglasses when outside. What should I know about heart disease, diabetes, and high blood pressure?  If you are 83-66 years of age, have your blood pressure checked every 3-5 years. If you are 73 years of age or older, have your blood pressure checked every year. You should have your blood pressure measured twice-once when you are at a hospital or clinic, and once when you are not at a hospital or clinic. Record the average of the two measurements. To check your blood pressure when you are not at a hospital or clinic, you can use:  An automated blood pressure machine at a pharmacy.  A home blood pressure monitor.  Talk to your health care provider about your target blood pressure.  If you are between 66-33 years old, ask your health care provider if you should take aspirin to prevent heart disease.  Have regular diabetes screenings by checking your fasting blood sugar level.  If you are at a normal weight and have a low risk for diabetes, have this test once every three years after the age of 52.  If you are overweight and have a high risk for diabetes, consider being tested at a younger age or more often.  A one-time screening for abdominal aortic aneurysm (AAA) by ultrasound is recommended for men aged 47-75 years who are current or former smokers. What should I know about preventing infection? Hepatitis B  If you have a higher risk for hepatitis B, you should be screened for this virus. Talk with your health care provider to find out if you are at risk for hepatitis B infection. Hepatitis C  Blood testing is recommended for:  Everyone born from [birth year] through 1966.  Anyone with known risk factors for hepatitis  C. Sexually Transmitted Diseases (STDs)  You should be screened each year for STDs including gonorrhea and chlamydia if:  You are sexually active and are younger than 72 years of age.  You are older than 72 years of age and your health care provider tells you that you are at risk for this type of infection.  Your sexual activity has changed since you were last screened and you are at an increased risk for chlamydia or gonorrhea. Ask your health care provider if you are at risk.  Talk with your health care provider about whether you are at high risk of being infected with HIV. Your health care provider may recommend a prescription medicine to help prevent HIV infection. What else can I do?  Schedule regular health, dental, and eye exams.  Stay current with your vaccines (immunizations).  Do not use any tobacco products, such as cigarettes, chewing tobacco, and e-cigarettes. If you need help quitting, ask your health care provider.  Limit alcohol intake to no more than 2 drinks per day. One drink equals 12 ounces of beer, 5 ounces of wine, or 1 ounces of hard liquor.  Do not use street drugs.  Do not share needles.  Ask your health care provider for  help if you need support or information about quitting drugs.  Tell your health care provider if you often feel depressed.  Tell your health care provider if you have ever been abused or do not feel safe at home. This information is not intended to replace advice given to you by your health care provider. Make sure you discuss any questions you have with your health care provider. Document Released: 06/21/2007 Document Revised: 08/22/2015 Document Reviewed: 09/26/2014 Elsevier Interactive Patient Education  2017 [employer].

## 2016-03-04 NOTE — Telephone Encounter (Signed)
Dr. Raliegh Ip Mr [place] in for [place]. Co of clearing throat; same c/o last year at CPE in Feb. Has tried nasal spray and has not helped. Would like to be referred to allergies. Due for medical management and labs,   Please advise apt with you vs referral to Allergy and Asthma center in [place] or both?  Tks,

## 2016-03-05 ENCOUNTER — Other Ambulatory Visit: Payer: Self-pay

## 2016-03-05 DIAGNOSIS — Z9109 Other allergy status, other than to drugs and biological substances: Secondary | ICD-10-CM

## 2016-03-05 NOTE — Progress Notes (Unsigned)
Ok to refer to [place] allergy and asthma center  Order placed.   Outreach to the patient to let him know a referral was placed.  AWV resent to Dr. Yong Channel, as Dr. Raliegh Ip was not in the office on the 27th when the AWV was completed.

## 2016-03-05 NOTE — Telephone Encounter (Signed)
Okay for referral to allergist per patient request

## 2016-03-20 NOTE — Telephone Encounter (Signed)
Call to Donald Montgomery and confirmed apt has been scheduled with allergist

## 2016-03-27 ENCOUNTER — Ambulatory Visit (HOSPITAL_COMMUNITY)
Admission: RE | Admit: 2016-03-27 | Discharge: 2016-03-27 | Disposition: A | Discharge status: Home / Self Care | Payer: Medicare Other | Source: Ambulatory Visit | Attending: Family Medicine | Admitting: Family Medicine

## 2016-03-27 DIAGNOSIS — Z87891 Personal history of nicotine dependence: Secondary | ICD-10-CM | POA: Diagnosis not present

## 2016-03-27 DIAGNOSIS — Z136 Encounter for screening for cardiovascular disorders: Secondary | ICD-10-CM | POA: Diagnosis not present

## 2016-04-14 ENCOUNTER — Encounter: Payer: Self-pay | Admitting: Allergy and Immunology

## 2016-04-14 ENCOUNTER — Ambulatory Visit (INDEPENDENT_AMBULATORY_CARE_PROVIDER_SITE_OTHER): Payer: Medicare Other | Admitting: Allergy and Immunology

## 2016-04-14 VITALS — BP 120/68 | HR 80 | Temp 98.1°F | Resp 16 | Ht 69.0 in | Wt 189.8 lb

## 2016-04-14 DIAGNOSIS — J309 Allergic rhinitis, unspecified: Secondary | ICD-10-CM | POA: Insufficient documentation

## 2016-04-14 DIAGNOSIS — R6889 Other general symptoms and signs: Secondary | ICD-10-CM

## 2016-04-14 DIAGNOSIS — K219 Gastro-esophageal reflux disease without esophagitis: Secondary | ICD-10-CM | POA: Insufficient documentation

## 2016-04-14 DIAGNOSIS — J300 Vasomotor rhinitis: Secondary | ICD-10-CM | POA: Diagnosis not present

## 2016-04-14 DIAGNOSIS — R0989 Other specified symptoms and signs involving the circulatory and respiratory systems: Secondary | ICD-10-CM

## 2016-04-14 DIAGNOSIS — J310 Chronic rhinitis: Secondary | ICD-10-CM | POA: Insufficient documentation

## 2016-04-14 HISTORY — DX: Gastro-esophageal reflux disease without esophagitis: K21.9

## 2016-04-14 MED ORDER — OMEPRAZOLE 20 MG PO CPDR: 20.0000 mg | DELAYED_RELEASE_CAPSULE | Freq: Every day | ORAL | 5 refills | Status: DC

## 2016-04-14 MED ORDER — AZELASTINE HCL 0.15 % NA SOLN: 2.0000 | Freq: Two times a day (BID) | NASAL | 5 refills | Status: DC | PRN

## 2016-04-14 NOTE — Assessment & Plan Note (Addendum)
Will treat empirically for post nasal drainage and silent reflux and, if symptoms improve, taper medications to find the lowest effective dose.  A prescription has been provided for azelastine nasal spray, 1-2 sprays per nostril 2 times daily as needed. Proper nasal spray technique has been discussed and demonstrated.   Nasal saline lavage (NeilMed) has been recommended as needed and prior to medicated nasal sprays along with instructions for proper administration.  Add guaifenesin [phone] mg (Mucinex)  twice daily as needed with adequate hydration as discussed.  A prescription has been provided for omeprazole 20 mg daily, 30 minutes prior to breakfast.  Reflux lifestyle modifications have been discussed and provided written form.  If symptoms persist or progress despite treatment plan as outlined above, otolaryngology evaluation may be warranted.

## 2016-04-14 NOTE — Addendum Note (Signed)
Addended by: Golda Acre C on: 04/14/2016 06:43 PM   Modules accepted: Level of Service

## 2016-04-14 NOTE — Patient Instructions (Addendum)
Chronic throat clearing Will treat empirically for post nasal drainage and silent reflux and, if symptoms improve, taper medications to find the lowest effective dose.  A prescription has been provided for azelastine nasal spray, 1-2 sprays per nostril 2 times daily as needed. Proper nasal spray technique has been discussed and demonstrated.   Nasal saline lavage (NeilMed) has been recommended as needed and prior to medicated nasal sprays along with instructions for proper administration.  Add guaifenesin [phone] mg (Mucinex)  twice daily as needed with adequate hydration as discussed.  A prescription has been provided for omeprazole 20 mg daily, 30 minutes prior to breakfast.  Reflux lifestyle modifications have been discussed and provided written form.  If symptoms persist or progress despite treatment plan as outlined above, otolaryngology evaluation may be warranted.   Return in about 2 months (around 06/14/2016), or if symptoms worsen or fail to improve.  Lifestyle Changes for Controlling GERD  When you have GERD, stomach acid feels as if it's backing up toward your mouth. Whether or not you take medication to control your GERD, your symptoms can often be improved with lifestyle changes.   Raise Your Head  Reflux is more likely to strike when you're lying down flat, because stomach fluid can  flow backward more easily. Raising the head of your bed 4-6 inches can help. To do this:  Slide blocks or books under the legs at the head of your bed. Or, place a wedge under  the mattress. Many foam stores can make a suitable wedge for you. The wedge  should run from your waist to the top of your head.  Don't just prop your head on several pillows. This increases pressure on your  stomach. It can make GERD worse.  Watch Your Eating Habits Certain foods may increase the acid in your stomach or relax the lower esophageal sphincter, making GERD more likely. It's best to avoid the  following:  Coffee, tea, and carbonated drinks (with and without caffeine)  Fatty, fried, or spicy food  Mint, chocolate, onions, and tomatoes  Any other foods that seem to irritate your stomach or cause you pain  Relieve the Pressure  Eat smaller meals, even if you have to eat more often.  Don't lie down right after you eat. Wait a few hours for your stomach to empty.  Avoid tight belts and tight-fitting clothes.  Lose excess weight.  Tobacco and Alcohol  Avoid smoking tobacco and drinking alcohol. They can make GERD symptoms worse.

## 2016-04-14 NOTE — Progress Notes (Signed)
New Patient Note  RE: Donald Montgomery MRN: [id number] DOB: 1945/12/17 Date of Office Visit: 04/14/2016  Referring provider: Marletta Lor, MD Primary care provider: Nyoka Cowden, MD  Chief Complaint: Throat clearing   History of present illness: Donald Montgomery is a 71 y.o. male seen today in consultation requested by Donald Kaufmann, MD. He complains of frequent throat clearing, particularly in the morning.  He states that he clears his throat for approximately 1 hour per morning and occasionally throughout the day.  He is uncertain but believes that he clears his throat more frequently after meals and when he lies down at bedtime.  He experiences occasional heartburn but not frequently and he doesn't notice specific correlation with throat clearing. He denies significant nasal congestion, sinus pressure, or post nasal drainage. He has tried oral antihistamines and nasal steroid without perceived benefit.    Assessment and plan: Chronic throat clearing Will treat empirically for post nasal drainage and silent reflux and, if symptoms improve, taper medications to find the lowest effective dose.  A prescription has been provided for azelastine nasal spray, 1-2 sprays per nostril 2 times daily as needed. Proper nasal spray technique has been discussed and demonstrated.   Nasal saline lavage (Donald Montgomery) has been recommended as needed and prior to medicated nasal sprays along with instructions for proper administration.  Add guaifenesin [phone] mg (Mucinex)  twice daily as needed with adequate hydration as discussed.  A prescription has been provided for omeprazole 20 mg daily, 30 minutes prior to breakfast.  Reflux lifestyle modifications have been discussed and provided written form.  If symptoms persist or progress despite treatment plan as outlined above, otolaryngology evaluation may be warranted.   Meds ordered this encounter  Medications  . Azelastine HCl  0.15 % SOLN    Sig: Place 2 sprays into both nostrils 2 (two) times daily as needed.    Dispense:  30 mL    Refill:  5  . omeprazole (PRILOSEC) 20 MG capsule    Sig: Take 1 capsule (20 mg total) by mouth daily before breakfast. 30 minutes prior to breakfast    Dispense:  30 capsule    Refill:  5    Diagnostics: Epicutaneous testing:  Negative despite a positive histamine control. Intradermal testing:  Negative despite a positive histamine control.    Physical examination: Blood pressure 120/68, pulse 80, temperature 98.1 F (36.7 C), temperature source Oral, resp. rate 16, height 5\' 9"  (1.753 m), weight 189 lb 12.8 oz (86.1 kg), SpO2 94 %.  General: Alert, interactive, in no acute distress. HEENT: TMs pearly gray, turbinates mildly edematous without discharge, post-pharynx mildly erythematous. Neck: Supple without lymphadenopathy. Lungs: Clear to auscultation without wheezing, rhonchi or rales. CV: Normal S1, S2 without murmurs. Abdomen: Nondistended, nontender. Skin: Warm and dry, without lesions or rashes. Extremities:  No clubbing, cyanosis or edema. Neuro:   Grossly intact.  Review of systems:  Review of systems negative except as noted in HPI / PMHx or noted below: Review of Systems  Constitutional: Negative.   HENT: Negative.   Eyes: Negative.   Respiratory: Negative.   Cardiovascular: Negative.   Gastrointestinal: Negative.   Genitourinary: Negative.   Musculoskeletal: Negative.   Skin: Negative.   Neurological: Negative.   Endo/Heme/Allergies: Negative.   Psychiatric/Behavioral: Negative.     Past medical history:  Past Medical History:  Diagnosis Date  . [facility])     Past surgical history:  Past Surgical History:  Procedure Laterality Date  .  TONSILLECTOMY      Family history: Family History  Problem Relation Age of Onset  . Heart disease Mother   . Stroke Brother   . Heart disease Brother   . Allergic rhinitis Neg Hx   . Angioedema Neg Hx    . Asthma Neg Hx   . Eczema Neg Hx   . Immunodeficiency Neg Hx   . Urticaria Neg Hx     Social history: Social History   Social History  . Marital status: Married    Spouse name: N/A  . Number of children: N/A  . Years of education: N/A   Occupational History  . Not on file.   Social History Main Topics  . Smoking status: Former Smoker    Types: Cigarettes, Pipe  . Smokeless tobacco: Never Used     Comment: will consider AAA check as he states he smoked over 100 cigerattes   . Alcohol use No  . Drug use: No  . Sexual activity: Not on file   Other Topics Concern  . 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  He is a former smoker having smoked from 1962-1980, averaging one pack per day.  There is no known mold/water damage in the home.  Allergies as of 04/14/2016   No Known Allergies     Medication List       Accurate as of 04/14/16  6:27 PM. Always use your most recent med list.          ANDROGEL PUMP 20.25 MG/ACT (1.62%) Gel Generic drug:  Testosterone Apply 4 application topically daily.   Azelastine HCl 0.15 % Soln Place 2 sprays into both nostrils 2 (two) times daily as needed.   cholecalciferol 1000 units tablet Commonly known as:  VITAMIN D Take 2,000 Units by mouth daily.   CO Q 10 PO Take 1 capsule by mouth daily.   Fish Oil 1000 MG Caps Take 1 capsule by mouth daily.   magnesium gluconate 500 MG tablet Commonly known as:  MAGONATE Take 500 mg by mouth daily.   omeprazole 20 MG capsule Commonly known as:  PRILOSEC Take 1 capsule (20 mg total) by mouth daily before breakfast. 30 minutes prior to breakfast   tadalafil 5 MG tablet Commonly known as:  CIALIS Take 2.5 mg by mouth daily as needed for erectile dysfunction.       Known  medication allergies: No Known Allergies  I appreciate the opportunity to take part in Donald Montgomery's care. Please do not hesitate to contact me with questions.  Sincerely,   R. Edgar Frisk, MD

## 2016-04-24 ENCOUNTER — Telehealth: Payer: Self-pay | Admitting: Allergy and Immunology

## 2016-04-24 MED ORDER — RANITIDINE HCL 150 MG PO TABS: 150.0000 mg | ORAL_TABLET | Freq: Two times a day (BID) | ORAL | 2 refills | Status: DC

## 2016-04-24 MED ORDER — FLUTICASONE PROPIONATE 50 MCG/ACT NA SUSP: 2.0000 | Freq: Two times a day (BID) | NASAL | 2 refills | Status: DC

## 2016-04-24 NOTE — Telephone Encounter (Signed)
Please asked the patient as he is doing everything that we recommended during his visit (those things that are listed on his AVS).  If he is and is still having problems, add fluticasone nasal spray, 2 sprays per nostril daily, and ranitidine 150 mg twice a day.  Please note, he is not to discontinue the azelastine or any other medication at this time, but add fluticasone nasal spray and ranitidine to the treatment plan.   If he continues to have problems despite these adjustments, he will need to see an ENT for further evaluation.  His primary care physician can make this referral if necessary.

## 2016-04-24 NOTE — Telephone Encounter (Signed)
Pt called and would like to talk with a nurse or Dr. Verlin Fester about him not any better at all. He wanted to know how long for it to start working. [phone]

## 2016-04-24 NOTE — Telephone Encounter (Signed)
Called patient, and went over regimen. Patient is currently doing everything you recommended. I informed patient that Dr. Verlin Fester wanted to add fluticasone nasal spray, 2 sprays per nostril daily, and ranitidine 150 mg twice a day. I also informed patient if he still have problems after the addition to follow up with an ENT. I let patient know if he needs a referral he can contact his PCP. Patient stated that we were suppose to fine out what is going not adding more medications. Patient was not too happy that we were adding medications to the regimen. He said he may just go ahead and an ENT. Patient asked me to send in the medications but place them on hold. I sent in scripts and placed a hold on them.

## 2016-04-24 NOTE — Telephone Encounter (Signed)
Patient is still having trouble in the mornings with his GERD. Clears his throat multiple times for the first 1-2 hours after waking. Please advise on next steps?

## 2016-04-24 NOTE — Telephone Encounter (Signed)
Please inform the patient that all epicutaneous and intradermal skin tests were negative and his history suggests nonallergic rhinitis with gastroesophageal reflux.  I am not certain what other problems he wants [place] to find, there are no other studies that we are able to do that would be beneficial.  That is a reason that if recommended going to an ENT if the medications are not working.

## 2016-04-25 NOTE — Telephone Encounter (Signed)
Patient returned call and informed that his testing here was negative and his history suggests nonallergic rhinitis with gastroesophageal reflux.  Patient wants to hold off taking medication and do consult with ENT at this time.  Patient verbalized his understanding of visit, medication options and ENT.

## 2016-04-25 NOTE — Telephone Encounter (Signed)
Called patient and left VM to call office.

## 2016-08-06 DIAGNOSIS — Z85828 Personal history of other malignant neoplasm of skin: Secondary | ICD-10-CM | POA: Diagnosis not present

## 2016-08-06 DIAGNOSIS — L570 Actinic keratosis: Secondary | ICD-10-CM | POA: Diagnosis not present

## 2016-08-06 DIAGNOSIS — D2239 Melanocytic nevi of other parts of face: Secondary | ICD-10-CM | POA: Diagnosis not present

## 2016-08-06 DIAGNOSIS — D1801 Hemangioma of skin and subcutaneous tissue: Secondary | ICD-10-CM | POA: Diagnosis not present

## 2016-08-06 DIAGNOSIS — L814 Other melanin hyperpigmentation: Secondary | ICD-10-CM | POA: Diagnosis not present

## 2016-08-06 DIAGNOSIS — Z86018 Personal history of other benign neoplasm: Secondary | ICD-10-CM | POA: Diagnosis not present

## 2016-08-06 DIAGNOSIS — L821 Other seborrheic keratosis: Secondary | ICD-10-CM | POA: Diagnosis not present

## 2016-08-06 DIAGNOSIS — D2271 Melanocytic nevi of right lower limb, including hip: Secondary | ICD-10-CM | POA: Diagnosis not present

## 2016-08-06 DIAGNOSIS — D225 Melanocytic nevi of trunk: Secondary | ICD-10-CM | POA: Diagnosis not present

## 2016-09-09 ENCOUNTER — Ambulatory Visit (INDEPENDENT_AMBULATORY_CARE_PROVIDER_SITE_OTHER): Payer: Medicare Other | Admitting: *Deleted

## 2016-09-09 DIAGNOSIS — Z23 Encounter for immunization: Secondary | ICD-10-CM | POA: Diagnosis not present

## 2016-09-25 ENCOUNTER — Encounter: Payer: Self-pay | Admitting: Internal Medicine

## 2016-10-13 DIAGNOSIS — E291 Testicular hypofunction: Secondary | ICD-10-CM | POA: Diagnosis not present

## 2016-10-20 DIAGNOSIS — N5201 Erectile dysfunction due to arterial insufficiency: Secondary | ICD-10-CM | POA: Diagnosis not present

## 2016-10-20 DIAGNOSIS — E291 Testicular hypofunction: Secondary | ICD-10-CM | POA: Diagnosis not present

## 2017-01-12 ENCOUNTER — Encounter: Payer: Self-pay | Admitting: Family Medicine

## 2017-01-12 ENCOUNTER — Ambulatory Visit (INDEPENDENT_AMBULATORY_CARE_PROVIDER_SITE_OTHER): Payer: Medicare Other | Admitting: Family Medicine

## 2017-01-12 ENCOUNTER — Ambulatory Visit: Payer: Self-pay | Admitting: *Deleted

## 2017-01-12 VITALS — BP 120/80 | HR 59 | Temp 98.1°F | Wt 191.6 lb

## 2017-01-12 DIAGNOSIS — R42 Dizziness and giddiness: Secondary | ICD-10-CM | POA: Diagnosis not present

## 2017-01-12 MED ORDER — MECLIZINE HCL 25 MG PO TABS: 25.0000 mg | ORAL_TABLET | Freq: Three times a day (TID) | ORAL | 0 refills | Status: DC | PRN

## 2017-01-12 NOTE — Progress Notes (Signed)
Subjective:    Patient ID: Donald Montgomery, male    DOB: 1945/02/29, 72 y.o.   MRN: [id number]  Chief Complaint  Patient presents with  . Dizziness  Patient accompanied by his wife.  HPI  Patient was seen today for acute concern.  Episode of dizziness started on Saturday then resolved shortly after.  Pt had another episode of dizziness that started Sunday around noon which cleared up by dinner.  Pt endorses feeling dizzy while standing, which improved with sitting.  Pt endorses drinking some water each day, maybe 2 glasses.  Pt is unsure if the room is spinning or he feels like he is moving.   Pt states he is no longer taking Azelastine, Flonase, Omeprazole, or Zantac, but continues to have congestion.  Past Medical History:  Diagnosis Date  . Cancer ([place])     No Known Allergies  ROS General: Denies fever, chills, night sweats, changes in weight, changes in appetite HEENT: Denies headaches, ear pain, changes in vision, rhinorrhea, sore throat  +dizziness, chronic congestion CV: Denies CP, palpitations, SOB, orthopnea Pulm: Denies SOB, cough, wheezing GI: Denies abdominal pain, nausea, vomiting, diarrhea, constipation GU: Denies dysuria, hematuria, frequency, vaginal discharge Msk: Denies muscle cramps, joint pains Neuro: Denies weakness, numbness, tingling Skin: Denies rashes, bruising Psych: Denies depression, anxiety, hallucinations     Objective:    Blood pressure 120/80, pulse (!) 59, temperature 98.1 F (36.7 C), temperature source Oral, weight 191 lb 9.6 oz (86.9 kg). Orthostatic VS for the past 24 hrs:  BP- Lying Pulse- Lying BP- Sitting Pulse- Sitting BP- Standing at 0 minutes Pulse- Standing at 0 minutes  01/12/17 0947 110/80 59 110/80 61 112/80 60    Gen. Pleasant, well-nourished, in no distress, normal affect  HEENT: [place]/AT, face symmetric, no scleral icterus, PERRLA, EOMI, nares patent without drainage, pharynx without erythema or exudate. Neck: No JVD, no  thyromegaly, no carotid bruits Lungs: no accessory muscle use, CTAB, no wheezes or rales Cardiovascular: RRR, no m/r/g, no peripheral edema Abdomen: BS present, soft, NT/ND Musculoskeletal: No deformities, no cyanosis or clubbing, normal tone Neuro:  A&Ox3, CN II-XII intact, normal gait   Wt Readings from Last 3 Encounters:  01/12/17 191 lb 9.6 oz (86.9 kg)  04/14/16 189 lb 12.8 oz (86.1 kg)  03/04/16 189 lb 4 oz (85.8 kg)    Lab Results  Component Value Date   WBC 6.3 02/26/2015   HGB 15.4 02/26/2015   HCT 46.3 02/26/2015   PLT 279.0 02/26/2015   GLUCOSE 104 (H) 02/26/2015   CHOL 157 02/26/2015   TRIG 82.0 02/26/2015   HDL 55.60 02/26/2015   LDLCALC 85 02/26/2015   ALT 15 02/26/2015   AST 16 02/26/2015   NA 141 02/26/2015   K 5.2 (H) 02/26/2015   CL 105 02/26/2015   CREATININE 0.98 02/26/2015   BUN 24 (H) 02/26/2015   CO2 29 02/26/2015   TSH 1.77 02/26/2015   PSA 0.74 11/27/2011    Assessment/Plan:  Vertigo  -currently asymptomatic -Discussed possible causes of vertigo including dehydration. -Given handout -Patient encouraged to increase p.o. intake of water daily -Discussed medication options.  Patient declines medication, however his wife insist on medication. -Patient advised to follow-up in the next 1-2 weeks, sooner if no improvement in symptoms - Plan: meclizine (ANTIVERT) 25 MG tablet  Pt to f/u with pcp and specialist for congestion.  Grier Mitts, MD

## 2017-01-12 NOTE — Patient Instructions (Addendum)
Remember to increase your intake of water.  A good goal is about four 16.9 oz bottles of water per day along with the other beverages you drink.Dizziness Dizziness is a common problem. It is a feeling of unsteadiness or light-headedness. You may feel like you are about to faint. Dizziness can lead to injury if you stumble or fall. Anyone can become dizzy, but dizziness is more common in older adults. This condition can be caused by a number of things, including medicines, dehydration, or illness. Follow these instructions at home: Eating and drinking  Drink enough fluid to keep your urine clear or pale yellow. This helps to keep you from becoming dehydrated. Try to drink more clear fluids, such as water.  Do not drink alcohol.  Limit your caffeine intake if told to do so by your health care provider. Check ingredients and nutrition facts to see if a food or beverage contains caffeine.  Limit your salt (sodium) intake if told to do so by your health care provider. Check ingredients and nutrition facts to see if a food or beverage contains sodium. Activity  Avoid making quick movements. ? Rise slowly from chairs and steady yourself until you feel okay. ? In the morning, first sit up on the side of the bed. When you feel okay, stand slowly while you hold onto something until you know that your balance is fine.  If you need to stand in one place for a long time, move your legs often. Tighten and relax the muscles in your legs while you are standing.  Do not drive or use heavy machinery if you feel dizzy.  Avoid bending down if you feel dizzy. Place items in your home so that they are easy for you to reach without leaning over. Lifestyle  Do not use any products that contain nicotine or tobacco, such as cigarettes and e-cigarettes. If you need help quitting, ask your health care provider.  Try to reduce your stress level by using methods such as yoga or meditation. Talk with your health care  provider if you need help to manage your stress. General instructions  Watch your dizziness for any changes.  Take over-the-counter and prescription medicines only as told by your health care provider. Talk with your health care provider if you think that your dizziness is caused by a medicine that you are taking.  Tell a friend or a family member that you are feeling dizzy. If he or she notices any changes in your behavior, have this person call your health care provider.  Keep all follow-up visits as told by your health care provider. This is important. Contact a health care provider if:  Your dizziness does not go away.  Your dizziness or light-headedness gets worse.  You feel nauseous.  You have reduced hearing.  You have new symptoms.  You are unsteady on your feet or you feel like the room is spinning. Get help right away if:  You vomit or have diarrhea and are unable to eat or drink anything.  You have problems talking, walking, swallowing, or using your arms, hands, or legs.  You feel generally weak.  You are not thinking clearly or you have trouble forming sentences. It may take a friend or family member to notice this.  You have chest pain, abdominal pain, shortness of breath, or sweating.  Your vision changes.  You have any bleeding.  You have a severe headache.  You have neck pain or a stiff neck.  You have  a fever. These symptoms may represent a serious problem that is an emergency. Do not wait to see if the symptoms will go away. Get medical help right away. Call your local emergency services (911 in the [place] Do not drive yourself to the hospital. Summary  Dizziness is a feeling of unsteadiness or light-headedness. This condition can be caused by a number of things, including medicines, dehydration, or illness.  Anyone can become dizzy, but dizziness is more common in older adults.  Drink enough fluid to keep your urine clear or pale yellow. Do not  drink alcohol.  Avoid making quick movements if you feel dizzy. Monitor your dizziness for any changes. This information is not intended to replace advice given to you by your health care provider. Make sure you discuss any questions you have with your health care provider. Document Released: 06/18/2000 Document Revised: 01/26/2016 Document Reviewed: 01/26/2016 Elsevier Interactive Patient Education  [employer].

## 2017-01-12 NOTE — Telephone Encounter (Signed)
Patient reports he has had a couple episodes of dizziness with the most recent on Saturday. He has not been evaluated for this and he is concerned that it has happened twicw now. He did have some dizziness years ago- but this is new for him.  Reason for Disposition . [1] MODERATE dizziness (e.g., vertigo; feels very unsteady, interferes with normal activities) AND [2] has NOT been evaluated by physician for this  Answer Assessment - Initial Assessment Questions 1. DESCRIPTION: "Describe your dizziness."     Dizzy spells-" woozy" 2. VERTIGO: "Do you feel like either you or the room is spinning or tilting?"      Not exactly 3. LIGHTHEADED: "Do you feel lightheaded?" (e.g., somewhat faint, woozy, weak upon standing)     Felt better sitting- standing felt woozy- felt more like he was going to fall 4. SEVERITY: "How bad is it?"  "Can you walk?"   - MILD - Feels unsteady but walking normally.   - MODERATE - Feels very unsteady when walking, but not falling; interferes with normal activities (e.g., school, work) .   - SEVERE - Unable to walk without falling (requires assistance).     Comes and goes 5. ONSET:  "When did the dizziness begin?"     Saturday morning- vertigo some years ago 6. AGGRAVATING FACTORS: "Does anything make it worse?" (e.g., standing, change in head position)     Standing made worse 7. CAUSE: "What do you think is causing the dizziness?"     standing made worse 8. RECURRENT SYMPTOM: "Have you had dizziness before?" If so, ask: "When was the last time?" "What happened that time?"     Did have an episode 2 months ago in the morning when got up. Saturday morning- felt funny. 9. OTHER SYMPTOMS: "Do you have any other symptoms?" (e.g., headache, weakness, numbness, vomiting, earache)     no 10. PREGNANCY: "Is there any chance you are pregnant?" "When was your last menstrual period?"       n/a  Protocols used: DIZZINESS - VERTIGO-A-AH

## 2017-01-28 DIAGNOSIS — H35363 Drusen (degenerative) of macula, bilateral: Secondary | ICD-10-CM | POA: Diagnosis not present

## 2017-01-28 DIAGNOSIS — H2513 Age-related nuclear cataract, bilateral: Secondary | ICD-10-CM | POA: Diagnosis not present

## 2017-01-28 DIAGNOSIS — H532 Diplopia: Secondary | ICD-10-CM | POA: Diagnosis not present

## 2017-01-28 DIAGNOSIS — H25013 Cortical age-related cataract, bilateral: Secondary | ICD-10-CM | POA: Diagnosis not present

## 2017-04-02 DIAGNOSIS — E291 Testicular hypofunction: Secondary | ICD-10-CM | POA: Diagnosis not present

## 2017-04-02 DIAGNOSIS — R948 Abnormal results of function studies of other organs and systems: Secondary | ICD-10-CM | POA: Diagnosis not present

## 2017-04-15 DIAGNOSIS — N400 Enlarged prostate without lower urinary tract symptoms: Secondary | ICD-10-CM | POA: Diagnosis not present

## 2017-04-15 DIAGNOSIS — N5201 Erectile dysfunction due to arterial insufficiency: Secondary | ICD-10-CM | POA: Diagnosis not present

## 2017-04-15 DIAGNOSIS — E291 Testicular hypofunction: Secondary | ICD-10-CM | POA: Diagnosis not present

## 2017-06-15 DIAGNOSIS — L820 Inflamed seborrheic keratosis: Secondary | ICD-10-CM | POA: Diagnosis not present

## 2017-07-01 ENCOUNTER — Encounter: Payer: Self-pay | Admitting: Family Medicine

## 2017-07-01 ENCOUNTER — Ambulatory Visit (INDEPENDENT_AMBULATORY_CARE_PROVIDER_SITE_OTHER): Payer: Medicare Other

## 2017-07-01 ENCOUNTER — Ambulatory Visit (INDEPENDENT_AMBULATORY_CARE_PROVIDER_SITE_OTHER): Payer: Medicare Other | Admitting: Family Medicine

## 2017-07-01 VITALS — BP 122/70 | HR 68 | Ht 70.0 in | Wt 191.4 lb

## 2017-07-01 VITALS — BP 122/70 | HR 68 | Temp 97.8°F | Ht 70.0 in | Wt 191.0 lb

## 2017-07-01 DIAGNOSIS — Z13220 Encounter for screening for lipoid disorders: Secondary | ICD-10-CM

## 2017-07-01 DIAGNOSIS — K219 Gastro-esophageal reflux disease without esophagitis: Secondary | ICD-10-CM

## 2017-07-01 DIAGNOSIS — Z0000 Encounter for general adult medical examination without abnormal findings: Secondary | ICD-10-CM | POA: Diagnosis not present

## 2017-07-01 NOTE — Progress Notes (Signed)
Subjective:   Donald Montgomery is a 72 y.o. male who presents for Medicare Annual/Subsequent preventive examination.   Reports health as Last year; requested apt with allergist  Wife has some health issues   Stated 2 brothers have died.  One in his 28's due to MI- no s/s The other at 6 due to stroke, no s/s. This was added to family hx.  Living in home children were raised; 2 story;  Can put a bedroom downstairs if needed  No issues or plans to move now Have one son  One grand dtr and she is 105 and stays with them a couple of days  Family is vegetarian   Diet- BMI WNL for older adults  Lipids 02/2015 chol/hdl ratio 3  Breakfast; eggs, muffin; fruit Lunch; sandwich Dinner cooked meal at home No beef and very little pork Chicken, fish, [place] Vegetarian meals more often now    Regular exercise still walks 1 hour per day and 3.5 miles to 4 miles  Tobacco hx; AAA 03/2016 neg   There are no preventive care reminders to display for this patient.   Colonoscopy 11/2008 and due 11/2018 PSA 11/2011   Qualifies for Shingles Vaccine-  Educated  Cardiac Risk Factors include: advanced age (>53men, >27 women);male gender;family history of premature cardiovascular disease     Objective:    Vitals: BP 122/70   Pulse 68   Ht 5\' 10"  (1.778 m)   Wt 191 lb 7 oz (86.8 kg)   SpO2 97%   BMI 27.47 kg/m   Body mass index is 27.47 kg/m.  Advanced Directives 07/01/2017 03/04/2016  Does Patient Have a Medical Advance Directive? Yes Yes    Tobacco Social History   Tobacco Use  Smoking Status Former Smoker  . Packs/day: 18.00  . Years: 1.00  . Pack years: 18.00  . Types: Cigarettes, Pipe  Smokeless Tobacco Never Used  Tobacco Comment   will consider AAA check as he states he smoked over 100 cigerattes      Counseling given: Yes Comment: will consider AAA check as he states he smoked over 100 cigerattes    Clinical Intake:     Past Medical History:  Diagnosis Date    . Cancer [facility])    Past Surgical History:  Procedure Laterality Date  . TONSILLECTOMY     Family History  Problem Relation Age of Onset  . Heart disease Mother   . Stroke Brother   . Heart disease Brother   . Allergic rhinitis Neg Hx   . Angioedema Neg Hx   . Asthma Neg Hx   . Eczema Neg Hx   . Immunodeficiency Neg Hx   . Urticaria Neg Hx    Social History   Socioeconomic History  . Marital status: Married    Spouse name: Not on file  . Number of children: Not on file  . Years of education: Not on file  . Highest education level: Not on file  Occupational History  . Not on file  Social Needs  . Financial resource strain: Not on file  . Food insecurity:    Worry: Not on file    Inability: Not on file  . Transportation needs:    Medical: Not on file    Non-medical: Not on file  Tobacco Use  . Smoking status: Former Smoker    Packs/day: 18.00    Years: 1.00    Pack years: 18.00    Types: Cigarettes, Pipe  . Smokeless tobacco: Never  Used  . Tobacco comment: will consider AAA check as he states he smoked over 100 cigerattes   Substance and Sexual Activity  . Alcohol use: No  . Drug use: No  . Sexual activity: Not on file  Lifestyle  . Physical activity:    Days per week: Not on file    Minutes per session: Not on file  . Stress: Not on file  Relationships  . Social connections:    Talks on phone: Not on file    Gets together: Not on file    Attends religious service: Not on file    Active member of club or organization: Not on file    Attends meetings of clubs or organizations: Not on file    Relationship status: Not on file  Other Topics Concern  . 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 ANDROGEL PUMP 20.25 MG/ACT (1.62%) GEL Apply 4 application topically daily.   . cholecalciferol (VITAMIN D)  1000 units tablet Take 2,000 Units by mouth daily.  . Coenzyme Q10 (CO Q 10 PO) Take 1 capsule by mouth daily.  . magnesium gluconate (MAGONATE) 500 MG tablet Take 500 mg by mouth daily.  . NON FORMULARY   . Omega-3 Fatty Acids (FISH OIL) 1000 MG CAPS Take 1 capsule by mouth daily.  . tadalafil (CIALIS) 5 MG tablet Take 2.5 mg by mouth daily as needed for erectile dysfunction.  . Azelastine HCl 0.15 % SOLN Place 2 sprays into both nostrils 2 (two) times daily as needed. (Patient not taking: Reported on 01/12/2017)  . fluticasone (FLONASE) 50 MCG/ACT nasal spray Place 2 sprays into both nostrils 2 (two) times daily. (Patient not taking: Reported on 01/12/2017)  . meclizine (ANTIVERT) 25 MG tablet Take 1 tablet (25 mg total) by mouth 3 (three) times daily as needed for dizziness. (Patient not taking: Reported on 07/01/2017)  . omeprazole (PRILOSEC) 20 MG capsule Take 1 capsule (20 mg total) by mouth daily before breakfast. 30 minutes prior to breakfast (Patient not taking: Reported on 01/12/2017)  . ranitidine (ZANTAC) 150 MG tablet Take 1 tablet (150 mg total) by mouth 2 (two) times daily. (Patient not taking: Reported on 01/12/2017)   No facility-administered encounter medications on file as of 07/01/2017.     Activities of Daily Living In your present state of health, do you have any difficulty performing the following activities: 07/01/2017  Hearing? N  Vision? N  Difficulty concentrating or making decisions? N  Walking or climbing stairs? N  Dressing or bathing? N  Doing errands, shopping? N  Preparing Food and eating ? N  Using the Toilet? N  In the past six months, have you accidently leaked urine? N  Do you have problems with loss of bowel control? N  Managing your Medications? N  Managing your Finances? N  Housekeeping or managing your Housekeeping? N  Some recent data might be hidden    Patient Care Team: Marletta Lor, MD as PCP - Dorisann Frames, MD as Attending Physician  (Urology)   Assessment:   This is a routine wellness examination for Donald Montgomery.  Exercise Activities and Dietary recommendations Current Exercise Habits: Home exercise routine, Type of exercise: strength training/weights;walking, Time (Minutes): 60, Frequency (Times/Week): 7, Weekly Exercise (Minutes/Week): 420, Intensity: Moderate  Goals    .  patient     Continue to walk and stay healthy Continue to use  Your weights x 2 per week        Fall Risk Fall Risk  07/01/2017 03/04/2016 02/26/2015  Falls in the past year? No No No     Depression Screen PHQ 2/9 Scores 07/01/2017 03/04/2016 02/26/2015  PHQ - 2 Score 0 0 0    Cognitive Function MMSE - Mini Mental State Exam 07/01/2017 03/04/2016  Not completed: (No Data) (No Data)     Ad8 score reviewed for issues:  Issues making decisions:  Less interest in hobbies / activities:  Repeats questions, stories (family complaining):  Trouble using ordinary gadgets (microwave, computer, phone):  Forgets the month or year:   Mismanaging finances:   Remembering appts:  Daily problems with thinking and/or memory: Ad8 score is=0   6CIT Screen 07/01/2017  What Year? 0 points  What month? 0 points  What time? 0 points  Count back from 20 0 points  Months in reverse 0 points  Repeat phrase 0 points  Total Score 0    Immunization History  Administered Date(s) Administered  . Influenza Whole 10/10/2009  . Influenza, High Dose Seasonal PF 11/28/2014, 10/10/2015, 09/09/2016  . Pneumococcal Conjugate-13 02/26/2015  . Pneumococcal Polysaccharide-23 12/01/2011  . Tdap 01/09/2012  . Zoster 01/09/2012      Screening Tests Health Maintenance  Topic Date Due  . INFLUENZA VACCINE  08/06/2017  . COLONOSCOPY  11/15/2018  . TETANUS/TDAP  01/08/2022  . Hepatitis C Screening  Completed  . PNA vac Low Risk Adult  Completed         Plan:      PCP Notes No issues   Health Maintenance Will repeat colonoscopy next year PSA due    Abnormal Screens  States recently his hands will go to sleep; "pins and needles" more the right; states it is more frequent  Feels it goes numb driving when he is squeezing the wheel  Going to sleep  Referrals  no  Patient concerns;  as noted   Nurse Concerns; As noted   Next PCP apt Today      I have personally reviewed and noted the following in the patient's chart:   . Medical and social history . Use of alcohol, tobacco or illicit drugs  . Current medications and supplements . Functional ability and status . Nutritional status . Physical activity . Advanced directives . List of other physicians . Hospitalizations, surgeries, and ER visits in previous 12 months . Vitals . Screenings to include cognitive, depression, and falls . Referrals and appointments  In addition, I have reviewed and discussed with patient certain preventive protocols, quality metrics, and best practice recommendations. A written personalized care plan for preventive services as well as general preventive health recommendations were provided to patient.     Wynetta Fines, RN  07/01/2017

## 2017-07-01 NOTE — Patient Instructions (Addendum)
Donald Montgomery , Thank you for taking time to come for your Medicare Wellness Visit. I appreciate your ongoing commitment to your health goals. Please review the following plan we discussed and let me know if I can assist you in the future.   Shingrix is a vaccine for the prevention of Shingles in Adults 50 and older.  If you are on Medicare, the shingrix is covered under your Part D plan, so you will take both of the vaccines in the series at your pharmacy. Please check with your benefits regarding applicable copays or out of pocket expenses.  The Shingrix is given in 2 vaccines approx 8 weeks apart. You must receive the 2nd dose prior to 6 months from receipt of the first. Please have the pharmacist print out you Immunization  dates for our office records   Will discuss "parasthesia " in your hands with Dr. Volanda Napoleon    These are the goals we discussed: Goals    . patient     Continue to walk and stay healthy Continue to use  Your weights x 2 per week        This is a list of the screening recommended for you and due dates:  Health Maintenance  Topic Date Due  . Flu Shot  08/06/2017  . Colon Cancer Screening  11/15/2018  . Tetanus Vaccine  01/08/2022  .  Hepatitis C: One time screening is recommended by Center for Disease Control  (CDC) for  adults born from [birth year] through 1965.   Completed  . Pneumonia vaccines  Completed      Fall Prevention in the Home Falls can cause injuries. They can happen to people of all ages. There are many things you can do to make your home safe and to help prevent falls. What can I do on the outside of my home?  Regularly fix the edges of walkways and driveways and fix any cracks.  Remove anything that might make you trip as you walk through a door, such as a raised step or threshold.  Trim any bushes or trees on the path to your home.  Use bright outdoor lighting.  Clear any walking paths of anything that might make someone trip, such as rocks or  tools.  Regularly check to see if handrails are loose or broken. Make sure that both sides of any steps have handrails.  Any raised decks and porches should have guardrails on the edges.  Have any leaves, snow, or ice cleared regularly.  Use sand or salt on walking paths during winter.  Clean up any spills in your garage right away. This includes oil or grease spills. What can I do in the bathroom?  Use night lights.  Install grab bars by the toilet and in the tub and shower. Do not use towel bars as grab bars.  Use non-skid mats or decals in the tub or shower.  If you need to sit down in the shower, use a plastic, non-slip stool.  Keep the floor dry. Clean up any water that spills on the floor as soon as it happens.  Remove soap buildup in the tub or shower regularly.  Attach bath mats securely with double-sided non-slip rug tape.  Do not have throw rugs and other things on the floor that can make you trip. What can I do in the bedroom?  Use night lights.  Make sure that you have a light by your bed that is easy to reach.  Do not use  any sheets or blankets that are too big for your bed. They should not hang down onto the floor.  Have a firm chair that has side arms. You can use this for support while you get dressed.  Do not have throw rugs and other things on the floor that can make you trip. What can I do in the kitchen?  Clean up any spills right away.  Avoid walking on wet floors.  Keep items that you use a lot in easy-to-reach places.  If you need to reach something above you, use a strong step stool that has a grab bar.  Keep electrical cords out of the way.  Do not use floor polish or wax that makes floors slippery. If you must use wax, use non-skid floor wax.  Do not have throw rugs and other things on the floor that can make you trip. What can I do with my stairs?  Do not leave any items on the stairs.  Make sure that there are handrails on both  sides of the stairs and use them. Fix handrails that are broken or loose. Make sure that handrails are as long as the stairways.  Check any carpeting to make sure that it is firmly attached to the stairs. Fix any carpet that is loose or worn.  Avoid having throw rugs at the top or bottom of the stairs. If you do have throw rugs, attach them to the floor with carpet tape.  Make sure that you have a light switch at the top of the stairs and the bottom of the stairs. If you do not have them, ask someone to add them for you. What else can I do to help prevent falls?  Wear shoes that: ? Do not have high heels. ? Have rubber bottoms. ? Are comfortable and fit you well. ? Are closed at the toe. Do not wear sandals.  If you use a stepladder: ? Make sure that it is fully opened. Do not climb a closed stepladder. ? Make sure that both sides of the stepladder are locked into place. ? Ask someone to hold it for you, if possible.  Clearly mark and make sure that you can see: ? Any grab bars or handrails. ? First and last steps. ? Where the edge of each step is.  Use tools that help you move around (mobility aids) if they are needed. These include: ? Canes. ? Walkers. ? Scooters. ? Crutches.  Turn on the lights when you go into a dark area. Replace any light bulbs as soon as they burn out.  Set up your furniture so you have a clear path. Avoid moving your furniture around.  If any of your floors are uneven, fix them.  If there are any pets around you, be aware of where they are.  Review your medicines with your doctor. Some medicines can make you feel dizzy. This can increase your chance of falling. Ask your doctor what other things that you can do to help prevent falls. This information is not intended to replace advice given to you by your health care provider. Make sure you discuss any questions you have with your health care provider. Document Released: 10/19/2008 Document Revised:  05/31/2015 Document Reviewed: 01/27/2014 Elsevier Interactive Patient Education  2018 [place] Maintenance, Male A healthy lifestyle and preventive care is important for your health and wellness. Ask your health care provider about what schedule of regular examinations is right for you. What should  I know about weight and diet? Eat a Healthy Diet  Eat plenty of vegetables, fruits, whole grains, low-fat dairy products, and lean protein.  Do not eat a lot of foods high in solid fats, added sugars, or salt.  Maintain a Healthy Weight Regular exercise can help you achieve or maintain a healthy weight. You should:  Do at least 150 minutes of exercise each week. The exercise should increase your heart rate and make you sweat (moderate-intensity exercise).  Do strength-training exercises at least twice a week.  Watch Your Levels of Cholesterol and Blood Lipids  Have your blood tested for lipids and cholesterol every 5 years starting at 73 years of age. If you are at high risk for heart disease, you should start having your blood tested when you are 73 years old. You may need to have your cholesterol levels checked more often if: ? Your lipid or cholesterol levels are high. ? You are older than 73 years of age. ? You are at high risk for heart disease.  What should I know about cancer screening? Many types of cancers can be detected early and may often be prevented. Lung Cancer  You should be screened every year for lung cancer if: ? You are a current smoker who has smoked for at least 30 years. ? You are a former smoker who has quit within the past 15 years.  Talk to your health care provider about your screening options, when you should start screening, and how often you should be screened.  Colorectal Cancer  Routine colorectal cancer screening usually begins at 73 years of age and should be repeated every 5-10 years until you are 73 years old. You may need to be  screened more often if early forms of precancerous polyps or small growths are found. Your health care provider may recommend screening at an earlier age if you have risk factors for colon cancer.  Your health care provider may recommend using home test kits to check for hidden blood in the stool.  A small camera at the end of a tube can be used to examine your colon (sigmoidoscopy or colonoscopy). This checks for the earliest forms of colorectal cancer.  Prostate and Testicular Cancer  Depending on your age and overall health, your health care provider may do certain tests to screen for prostate and testicular cancer.  Talk to your health care provider about any symptoms or concerns you have about testicular or prostate cancer.  Skin Cancer  Check your skin from head to toe regularly.  Tell your health care provider about any new moles or changes in moles, especially if: ? There is a change in a mole's size, shape, or color. ? You have a mole that is larger than a pencil eraser.  Always use sunscreen. Apply sunscreen liberally and repeat throughout the day.  Protect yourself by wearing long sleeves, pants, a wide-brimmed hat, and sunglasses when outside.  What should I know about heart disease, diabetes, and high blood pressure?  If you are 32-52 years of age, have your blood pressure checked every 3-5 years. If you are 6 years of age or older, have your blood pressure checked every year. You should have your blood pressure measured twice-once when you are at a hospital or clinic, and once when you are not at a hospital or clinic. Record the average of the two measurements. To check your blood pressure when you are not at a hospital or clinic, you can use: ?  An automated blood pressure machine at a pharmacy. ? A home blood pressure monitor.  Talk to your health care provider about your target blood pressure.  If you are between 16-31 years old, ask your health care provider if you  should take aspirin to prevent heart disease.  Have regular diabetes screenings by checking your fasting blood sugar level. ? If you are at a normal weight and have a low risk for diabetes, have this test once every three years after the age of 9. ? If you are overweight and have a high risk for diabetes, consider being tested at a younger age or more often.  A one-time screening for abdominal aortic aneurysm (AAA) by ultrasound is recommended for men aged 87-75 years who are current or former smokers. What should I know about preventing infection? Hepatitis B If you have a higher risk for hepatitis B, you should be screened for this virus. Talk with your health care provider to find out if you are at risk for hepatitis B infection. Hepatitis C Blood testing is recommended for:  Everyone born from 45 through 1966.  Anyone with known risk factors for hepatitis C.  Sexually Transmitted Diseases (STDs)  You should be screened each year for STDs including gonorrhea and chlamydia if: ? You are sexually active and are younger than 73 years of age. ? You are older than 73 years of age and your health care provider tells you that you are at risk for this type of infection. ? Your sexual activity has changed since you were last screened and you are at an increased risk for chlamydia or gonorrhea. Ask your health care provider if you are at risk.  Talk with your health care provider about whether you are at high risk of being infected with HIV. Your health care provider may recommend a prescription medicine to help prevent HIV infection.  What else can I do?  Schedule regular health, dental, and eye exams.  Stay current with your vaccines (immunizations).  Do not use any tobacco products, such as cigarettes, chewing tobacco, and e-cigarettes. If you need help quitting, ask your health care provider.  Limit alcohol intake to no more than 2 drinks per day. One drink equals 12 ounces of beer,  5 ounces of wine, or 1 ounces of hard liquor.  Do not use street drugs.  Do not share needles.  Ask your health care provider for help if you need support or information about quitting drugs.  Tell your health care provider if you often feel depressed.  Tell your health care provider if you have ever been abused or do not feel safe at home. This information is not intended to replace advice given to you by your health care provider. Make sure you discuss any questions you have with your health care provider. Document Released: 06/21/2007 Document Revised: 08/22/2015 Document Reviewed: 09/26/2014 Elsevier Interactive Patient Education  [employer].

## 2017-07-01 NOTE — Progress Notes (Signed)
Subjective:    Patient ID: Donald Montgomery, male    DOB: 03/15/1945, 72 y.o.   MRN: [id number]  No chief complaint on file.   HPI Patient was seen today for AWV, TOC, and f/u.  Pt states he has been doing well since last OFV.  Pt formerly seen by Dr. Burnice Logan.  Seen on 01/12/17 by this provider. Pt's vertigo resolved and he did not take any of the meclizine.  No dizziness since.  GERD: -taking zantac150 mg daily -also seen by asthma and allergy.  Past Medical History:  Diagnosis Date  . Cancer ([place])     No Known Allergies  ROS General: Denies fever, chills, night sweats, changes in weight, changes in appetite HEENT: Denies headaches, ear pain, changes in vision, rhinorrhea, sore throat CV: Denies CP, palpitations, SOB, orthopnea Pulm: Denies SOB, cough, wheezing GI: Denies abdominal pain, nausea, vomiting, diarrhea, constipation GU: Denies dysuria, hematuria, frequency, vaginal discharge Msk: Denies muscle cramps, joint pains Neuro: Denies weakness, numbness, tingling Skin: Denies rashes, bruising Psych: Denies depression, anxiety, hallucinations     Objective:    Blood pressure 122/70, pulse 68, temperature 97.8 F (36.6 C), temperature source Oral, height 5\' 10"  (1.778 m), weight 191 lb (86.6 kg), SpO2 97 %.   Gen. Pleasant, well-nourished, in no distress, normal affect   HEENT: [place]/AT, face symmetric, no scleral icterus, PERRLA, nares patent without drainage, pharynx without erythema or exudate. Neck: No JVD, no thyromegaly, no carotid bruits Lungs: no accessory muscle use, CTAB, no wheezes or rales Cardiovascular: RRR, no m/r/g, no peripheral edema Abdomen: BS present, soft, NT/ND Neuro:  A&Ox3, CN II-XII intact, normal gait   Wt Readings from Last 3 Encounters:  07/01/17 191 lb (86.6 kg)  07/01/17 191 lb 7 oz (86.8 kg)  01/12/17 191 lb 9.6 oz (86.9 kg)    Lab Results  Component Value Date   WBC 6.3 02/26/2015   HGB 15.4 02/26/2015   HCT 46.3  02/26/2015   PLT 279.0 02/26/2015   GLUCOSE 104 (H) 02/26/2015   CHOL 157 02/26/2015   TRIG 82.0 02/26/2015   HDL 55.60 02/26/2015   LDLCALC 85 02/26/2015   ALT 15 02/26/2015   AST 16 02/26/2015   NA 141 02/26/2015   K 5.2 (H) 02/26/2015   CL 105 02/26/2015   CREATININE 0.98 02/26/2015   BUN 24 (H) 02/26/2015   CO2 29 02/26/2015   TSH 1.77 02/26/2015   PSA 0.74 11/27/2011    Assessment/Plan:  Gastroesophageal reflux disease, esophagitis presence not specified  -continue zantac 150 mg  - Plan: CBC with Differential/Platelet, Basic metabolic panel  Screening for cholesterol level  - Plan: Lipid panel  F/u prn  Grier Mitts, MD

## 2017-07-01 NOTE — Patient Instructions (Signed)

## 2017-07-03 ENCOUNTER — Encounter: Payer: Self-pay | Admitting: Family Medicine

## 2017-07-03 NOTE — Progress Notes (Signed)
Medical screening examination/treatment was performed by qualified clinical staff member and as supervising physician I was immediately available for consultation/collaboration. I have reviewed documentation and agree with assessment and plan.  Felicia Bloomquist R Infiniti Hoefling, MD  

## 2017-07-22 ENCOUNTER — Other Ambulatory Visit (INDEPENDENT_AMBULATORY_CARE_PROVIDER_SITE_OTHER): Payer: Medicare Other

## 2017-07-22 DIAGNOSIS — Z13220 Encounter for screening for lipoid disorders: Secondary | ICD-10-CM | POA: Diagnosis not present

## 2017-07-22 DIAGNOSIS — K219 Gastro-esophageal reflux disease without esophagitis: Secondary | ICD-10-CM

## 2017-07-22 LAB — CBC WITH DIFFERENTIAL/PLATELET
BASOS PCT: 0.7 % (ref 0.0–3.0)
Basophils Absolute: 0 10*3/uL (ref 0.0–0.1)
EOS PCT: 2.4 % (ref 0.0–5.0)
Eosinophils Absolute: 0.1 10*3/uL (ref 0.0–0.7)
HEMATOCRIT: 46.3 % (ref 39.0–52.0)
Hemoglobin: 15.6 g/dL (ref 13.0–17.0)
LYMPHS PCT: 32.9 % (ref 12.0–46.0)
Lymphs Abs: 1.3 10*3/uL (ref 0.7–4.0)
MCHC: 33.6 g/dL (ref 30.0–36.0)
MCV: 94.5 fl (ref 78.0–100.0)
Monocytes Absolute: 0.4 10*3/uL (ref 0.1–1.0)
Monocytes Relative: 10.1 % (ref 3.0–12.0)
Neutro Abs: 2.2 10*3/uL (ref 1.4–7.7)
Neutrophils Relative %: 53.9 % (ref 43.0–77.0)
Platelets: 251 10*3/uL (ref 150.0–400.0)
RBC: 4.9 Mil/uL (ref 4.22–5.81)
RDW: 13.4 % (ref 11.5–15.5)
WBC: 4.1 10*3/uL (ref 4.0–10.5)

## 2017-07-22 LAB — BASIC METABOLIC PANEL
BUN: 20 mg/dL (ref 6–23)
CALCIUM: 9.2 mg/dL (ref 8.4–10.5)
CO2: 30 mEq/L (ref 19–32)
CREATININE: 1.03 mg/dL (ref 0.40–1.50)
Chloride: 104 mEq/L (ref 96–112)
GFR: 75.22 mL/min (ref >60.00)
GLUCOSE: 104 mg/dL — AB (ref 70–99)
Potassium: 4.3 mEq/L (ref 3.5–5.1)
Sodium: 140 mEq/L (ref 135–145)

## 2017-07-22 LAB — LIPID PANEL
Cholesterol: 148 mg/dL (ref 0–200)
HDL: 57.4 mg/dL (ref >39.00)
LDL Cholesterol: 78 mg/dL (ref 0–99)
NONHDL: 90.78
Total CHOL/HDL Ratio: 3
Triglycerides: 62 mg/dL (ref 0.0–149.0)
VLDL: 12.4 mg/dL (ref 0.0–40.0)

## 2017-10-19 ENCOUNTER — Ambulatory Visit (INDEPENDENT_AMBULATORY_CARE_PROVIDER_SITE_OTHER): Payer: Medicare Other | Admitting: *Deleted

## 2017-10-19 DIAGNOSIS — Z23 Encounter for immunization: Secondary | ICD-10-CM

## 2017-11-05 DIAGNOSIS — E291 Testicular hypofunction: Secondary | ICD-10-CM | POA: Diagnosis not present

## 2017-11-11 DIAGNOSIS — E291 Testicular hypofunction: Secondary | ICD-10-CM | POA: Diagnosis not present

## 2017-11-11 DIAGNOSIS — N5201 Erectile dysfunction due to arterial insufficiency: Secondary | ICD-10-CM | POA: Diagnosis not present

## 2017-11-17 DIAGNOSIS — D2271 Melanocytic nevi of right lower limb, including hip: Secondary | ICD-10-CM | POA: Diagnosis not present

## 2017-11-17 DIAGNOSIS — Z86018 Personal history of other benign neoplasm: Secondary | ICD-10-CM | POA: Diagnosis not present

## 2017-11-17 DIAGNOSIS — D2239 Melanocytic nevi of other parts of face: Secondary | ICD-10-CM | POA: Diagnosis not present

## 2017-11-17 DIAGNOSIS — L821 Other seborrheic keratosis: Secondary | ICD-10-CM | POA: Diagnosis not present

## 2017-11-17 DIAGNOSIS — Z85828 Personal history of other malignant neoplasm of skin: Secondary | ICD-10-CM | POA: Diagnosis not present

## 2017-11-17 DIAGNOSIS — L570 Actinic keratosis: Secondary | ICD-10-CM | POA: Diagnosis not present

## 2017-11-17 DIAGNOSIS — Z23 Encounter for immunization: Secondary | ICD-10-CM | POA: Diagnosis not present

## 2017-11-17 DIAGNOSIS — D225 Melanocytic nevi of trunk: Secondary | ICD-10-CM | POA: Diagnosis not present

## 2017-11-17 DIAGNOSIS — L814 Other melanin hyperpigmentation: Secondary | ICD-10-CM | POA: Diagnosis not present

## 2018-05-07 DIAGNOSIS — N401 Enlarged prostate with lower urinary tract symptoms: Secondary | ICD-10-CM | POA: Diagnosis not present

## 2018-05-07 DIAGNOSIS — E291 Testicular hypofunction: Secondary | ICD-10-CM | POA: Diagnosis not present

## 2018-05-12 DIAGNOSIS — E291 Testicular hypofunction: Secondary | ICD-10-CM | POA: Diagnosis not present

## 2018-05-12 DIAGNOSIS — N5201 Erectile dysfunction due to arterial insufficiency: Secondary | ICD-10-CM | POA: Diagnosis not present

## 2018-06-08 ENCOUNTER — Telehealth: Payer: Self-pay

## 2018-06-08 NOTE — Telephone Encounter (Signed)
lvm for pt to call back needs to r/s to July crm created

## 2018-06-22 DIAGNOSIS — M25562 Pain in left knee: Secondary | ICD-10-CM | POA: Diagnosis not present

## 2018-07-06 ENCOUNTER — Ambulatory Visit: Payer: Medicare Other

## 2018-08-06 ENCOUNTER — Ambulatory Visit: Payer: Medicare Other

## 2018-08-26 ENCOUNTER — Ambulatory Visit: Payer: Medicare Other

## 2018-08-27 ENCOUNTER — Encounter: Payer: Self-pay | Admitting: Internal Medicine

## 2018-08-27 DIAGNOSIS — H25013 Cortical age-related cataract, bilateral: Secondary | ICD-10-CM | POA: Diagnosis not present

## 2018-08-27 DIAGNOSIS — H35363 Drusen (degenerative) of macula, bilateral: Secondary | ICD-10-CM | POA: Diagnosis not present

## 2018-08-27 DIAGNOSIS — H532 Diplopia: Secondary | ICD-10-CM | POA: Diagnosis not present

## 2018-08-27 DIAGNOSIS — H2513 Age-related nuclear cataract, bilateral: Secondary | ICD-10-CM | POA: Diagnosis not present

## 2018-09-06 DIAGNOSIS — Z23 Encounter for immunization: Secondary | ICD-10-CM | POA: Diagnosis not present

## 2018-09-08 ENCOUNTER — Encounter: Payer: Self-pay | Admitting: *Deleted

## 2018-09-21 ENCOUNTER — Ambulatory Visit: Payer: Medicare Other

## 2018-10-20 ENCOUNTER — Other Ambulatory Visit: Payer: Self-pay

## 2018-10-20 DIAGNOSIS — Z20822 Contact with and (suspected) exposure to covid-19: Secondary | ICD-10-CM

## 2018-10-20 DIAGNOSIS — Z20828 Contact with and (suspected) exposure to other viral communicable diseases: Secondary | ICD-10-CM | POA: Diagnosis not present

## 2018-10-23 LAB — NOVEL CORONAVIRUS, NAA: SARS-CoV-2, NAA: NOT DETECTED

## 2018-11-10 DIAGNOSIS — E291 Testicular hypofunction: Secondary | ICD-10-CM | POA: Diagnosis not present

## 2018-11-17 DIAGNOSIS — N5201 Erectile dysfunction due to arterial insufficiency: Secondary | ICD-10-CM | POA: Diagnosis not present

## 2018-11-17 DIAGNOSIS — N401 Enlarged prostate with lower urinary tract symptoms: Secondary | ICD-10-CM | POA: Diagnosis not present

## 2018-11-17 DIAGNOSIS — R3912 Poor urinary stream: Secondary | ICD-10-CM | POA: Diagnosis not present

## 2018-11-17 DIAGNOSIS — E291 Testicular hypofunction: Secondary | ICD-10-CM | POA: Diagnosis not present

## 2018-11-24 DIAGNOSIS — Z86018 Personal history of other benign neoplasm: Secondary | ICD-10-CM | POA: Diagnosis not present

## 2018-11-24 DIAGNOSIS — L821 Other seborrheic keratosis: Secondary | ICD-10-CM | POA: Diagnosis not present

## 2018-11-24 DIAGNOSIS — Z23 Encounter for immunization: Secondary | ICD-10-CM | POA: Diagnosis not present

## 2018-11-24 DIAGNOSIS — D225 Melanocytic nevi of trunk: Secondary | ICD-10-CM | POA: Diagnosis not present

## 2018-11-24 DIAGNOSIS — L814 Other melanin hyperpigmentation: Secondary | ICD-10-CM | POA: Diagnosis not present

## 2018-11-24 DIAGNOSIS — L570 Actinic keratosis: Secondary | ICD-10-CM | POA: Diagnosis not present

## 2018-11-24 DIAGNOSIS — D2271 Melanocytic nevi of right lower limb, including hip: Secondary | ICD-10-CM | POA: Diagnosis not present

## 2018-11-24 DIAGNOSIS — Z85828 Personal history of other malignant neoplasm of skin: Secondary | ICD-10-CM | POA: Diagnosis not present

## 2019-02-01 ENCOUNTER — Encounter: Payer: Self-pay | Admitting: Family Medicine

## 2019-02-03 ENCOUNTER — Ambulatory Visit: Payer: Medicare Other

## 2019-02-04 ENCOUNTER — Encounter (HOSPITAL_COMMUNITY): Payer: Self-pay

## 2019-02-04 ENCOUNTER — Telehealth (INDEPENDENT_AMBULATORY_CARE_PROVIDER_SITE_OTHER): Payer: Medicare Other | Admitting: Family Medicine

## 2019-02-04 ENCOUNTER — Telehealth: Payer: Self-pay | Admitting: Family Medicine

## 2019-02-04 ENCOUNTER — Other Ambulatory Visit (INDEPENDENT_AMBULATORY_CARE_PROVIDER_SITE_OTHER): Payer: Medicare Other

## 2019-02-04 ENCOUNTER — Emergency Department (HOSPITAL_COMMUNITY): Payer: Medicare Other

## 2019-02-04 ENCOUNTER — Inpatient Hospital Stay (HOSPITAL_COMMUNITY)
Admission: EM | Admit: 2019-02-04 | Discharge: 2019-02-07 | DRG: 389 | Disposition: A | Discharge status: Home / Self Care | Payer: Medicare Other | Attending: Surgery | Admitting: Surgery

## 2019-02-04 ENCOUNTER — Other Ambulatory Visit: Payer: Self-pay

## 2019-02-04 DIAGNOSIS — Z20822 Contact with and (suspected) exposure to covid-19: Secondary | ICD-10-CM | POA: Diagnosis present

## 2019-02-04 DIAGNOSIS — Z8249 Family history of ischemic heart disease and other diseases of the circulatory system: Secondary | ICD-10-CM | POA: Diagnosis not present

## 2019-02-04 DIAGNOSIS — Z03818 Encounter for observation for suspected exposure to other biological agents ruled out: Secondary | ICD-10-CM | POA: Diagnosis not present

## 2019-02-04 DIAGNOSIS — N179 Acute kidney failure, unspecified: Secondary | ICD-10-CM | POA: Diagnosis present

## 2019-02-04 DIAGNOSIS — K56609 Unspecified intestinal obstruction, unspecified as to partial versus complete obstruction: Principal | ICD-10-CM | POA: Diagnosis present

## 2019-02-04 DIAGNOSIS — Z79899 Other long term (current) drug therapy: Secondary | ICD-10-CM | POA: Diagnosis not present

## 2019-02-04 DIAGNOSIS — Z823 Family history of stroke: Secondary | ICD-10-CM | POA: Diagnosis not present

## 2019-02-04 DIAGNOSIS — R1013 Epigastric pain: Secondary | ICD-10-CM | POA: Diagnosis not present

## 2019-02-04 DIAGNOSIS — R1010 Upper abdominal pain, unspecified: Secondary | ICD-10-CM

## 2019-02-04 DIAGNOSIS — K219 Gastro-esophageal reflux disease without esophagitis: Secondary | ICD-10-CM | POA: Diagnosis present

## 2019-02-04 DIAGNOSIS — Z87891 Personal history of nicotine dependence: Secondary | ICD-10-CM

## 2019-02-04 DIAGNOSIS — K5669 Other partial intestinal obstruction: Secondary | ICD-10-CM | POA: Diagnosis not present

## 2019-02-04 DIAGNOSIS — R9431 Abnormal electrocardiogram [ECG] [EKG]: Secondary | ICD-10-CM | POA: Diagnosis not present

## 2019-02-04 DIAGNOSIS — K56600 Partial intestinal obstruction, unspecified as to cause: Secondary | ICD-10-CM | POA: Diagnosis present

## 2019-02-04 HISTORY — DX: Unspecified intestinal obstruction, unspecified as to partial versus complete obstruction: K56.609

## 2019-02-04 LAB — RESPIRATORY PANEL BY RT PCR (FLU A&B, COVID)
Influenza A by PCR: NEGATIVE
Influenza B by PCR: NEGATIVE
SARS Coronavirus 2 by RT PCR: NEGATIVE

## 2019-02-04 LAB — COMPREHENSIVE METABOLIC PANEL
ALT: 19 U/L (ref 0–53)
ALT: 23 U/L (ref 0–44)
AST: 18 U/L (ref 0–37)
AST: 25 U/L (ref 15–41)
Albumin: 4.4 g/dL (ref 3.5–5.2)
Albumin: 4.4 g/dL (ref 3.5–5.0)
Alkaline Phosphatase: 65 U/L (ref 39–117)
Alkaline Phosphatase: 63 U/L (ref 38–126)
Anion gap: 11 (ref 5–15)
BUN: 21 mg/dL (ref 6–23)
BUN: 19 mg/dL (ref 8–23)
CO2: 32 mEq/L (ref 19–32)
CO2: 26 mmol/L (ref 22–32)
Calcium: 10 mg/dL (ref 8.4–10.5)
Calcium: 10.1 mg/dL (ref 8.9–10.3)
Chloride: 99 mEq/L (ref 96–112)
Chloride: 100 mmol/L (ref 98–111)
Creatinine, Ser: 0.92 mg/dL (ref 0.40–1.50)
Creatinine, Ser: 0.97 mg/dL (ref 0.61–1.24)
GFR calc Af Amer: >60 mL/min (ref >60)
GFR calc non Af Amer: >60 mL/min (ref >60)
GFR: 80.29 mL/min (ref >60.00)
Glucose, Bld: 122 mg/dL — ABNORMAL HIGH (ref 70–99)
Glucose, Bld: 133 mg/dL — ABNORMAL HIGH (ref 70–99)
Potassium: 4.1 mEq/L (ref 3.5–5.1)
Potassium: 4.3 mmol/L (ref 3.5–5.1)
Sodium: 135 mEq/L (ref 135–145)
Sodium: 137 mmol/L (ref 135–145)
Total Bilirubin: 0.4 mg/dL (ref 0.2–1.2)
Total Bilirubin: 0.8 mg/dL (ref 0.3–1.2)
Total Protein: 7 g/dL (ref 6.0–8.3)
Total Protein: 7.7 g/dL (ref 6.5–8.1)

## 2019-02-04 LAB — CBC WITH DIFFERENTIAL/PLATELET
Abs Immature Granulocytes: 0.1 10*3/uL — ABNORMAL HIGH (ref 0.00–0.07)
Basophils Absolute: 0 10*3/uL (ref 0.0–0.1)
Basophils Relative: 0 %
Eosinophils Absolute: 0 10*3/uL (ref 0.0–0.5)
Eosinophils Relative: 0 %
HCT: 50.3 % (ref 39.0–52.0)
Hemoglobin: 16.7 g/dL (ref 13.0–17.0)
Immature Granulocytes: 1 %
Lymphocytes Relative: 7 %
Lymphs Abs: 0.8 10*3/uL (ref 0.7–4.0)
MCH: 31.6 pg (ref 26.0–34.0)
MCHC: 33.2 g/dL (ref 30.0–36.0)
MCV: 95.1 fL (ref 80.0–100.0)
Monocytes Absolute: 0.5 10*3/uL (ref 0.1–1.0)
Monocytes Relative: 4 %
Neutro Abs: 10.1 10*3/uL — ABNORMAL HIGH (ref 1.7–7.7)
Neutrophils Relative %: 88 %
Platelets: 279 10*3/uL (ref 150–400)
RBC: 5.29 MIL/uL (ref 4.22–5.81)
RDW: 12.3 % (ref 11.5–15.5)
WBC: 11.5 10*3/uL — ABNORMAL HIGH (ref 4.0–10.5)
nRBC: 0 % (ref 0.0–0.2)

## 2019-02-04 LAB — CBC
HCT: 45.7 % (ref 39.0–52.0)
Hemoglobin: 15.1 g/dL (ref 13.0–17.0)
MCHC: 33 g/dL (ref 30.0–36.0)
MCV: 95.2 fl (ref 78.0–100.0)
Platelets: 284 10*3/uL (ref 150.0–400.0)
RBC: 4.8 Mil/uL (ref 4.22–5.81)
RDW: 13.2 % (ref 11.5–15.5)
WBC: 11.1 10*3/uL — ABNORMAL HIGH (ref 4.0–10.5)

## 2019-02-04 LAB — ABO/RH: ABO/RH(D): O POS

## 2019-02-04 LAB — LIPASE: Lipase: 41 U/L (ref 11.0–59.0)

## 2019-02-04 LAB — LACTIC ACID, PLASMA: Lactic Acid, Venous: 1.6 mmol/L (ref 0.5–1.9)

## 2019-02-04 LAB — TYPE AND SCREEN
ABO/RH(D): O POS
Antibody Screen: NEGATIVE

## 2019-02-04 LAB — LIPASE, BLOOD: Lipase: 30 U/L (ref 11–51)

## 2019-02-04 MED ORDER — LACTATED RINGERS IV BOLUS
1000.0000 mL | Freq: Once | INTRAVENOUS | Status: AC
  Administered 2019-02-04: 1000 mL via INTRAVENOUS

## 2019-02-04 MED ORDER — ACETAMINOPHEN 325 MG PO TABS: 650.0000 mg | ORAL_TABLET | Freq: Four times a day (QID) | ORAL | Status: DC | PRN

## 2019-02-04 MED ORDER — SIMETHICONE 80 MG PO CHEW: 40.0000 mg | CHEWABLE_TABLET | Freq: Four times a day (QID) | ORAL | Status: DC | PRN

## 2019-02-04 MED ORDER — MORPHINE SULFATE (PF) 2 MG/ML IV SOLN
1.0000 mg | INTRAVENOUS | Status: DC | PRN
  Administered 2019-02-05 (×2): 1 mg via INTRAVENOUS
  Filled 2019-02-04 (×2): qty 1

## 2019-02-04 MED ORDER — SODIUM CHLORIDE 0.9 % IV SOLN: INTRAVENOUS | Status: DC

## 2019-02-04 MED ORDER — ONDANSETRON 4 MG PO TBDP: 4.0000 mg | ORAL_TABLET | Freq: Four times a day (QID) | ORAL | Status: DC | PRN

## 2019-02-04 MED ORDER — IOHEXOL 300 MG/ML  SOLN
100.0000 mL | Freq: Once | INTRAMUSCULAR | Status: AC | PRN
  Administered 2019-02-04: 100 mL via INTRAVENOUS

## 2019-02-04 MED ORDER — ENOXAPARIN SODIUM 40 MG/0.4ML ~~LOC~~ SOLN
40.0000 mg | SUBCUTANEOUS | Status: DC
  Administered 2019-02-04 – 2019-02-06 (×3): 40 mg via SUBCUTANEOUS
  Filled 2019-02-04 (×3): qty 0.4

## 2019-02-04 MED ORDER — MORPHINE SULFATE (PF) 4 MG/ML IV SOLN
4.0000 mg | Freq: Once | INTRAVENOUS | Status: DC
  Filled 2019-02-04: qty 1

## 2019-02-04 MED ORDER — ONDANSETRON HCL 4 MG/2ML IJ SOLN
4.0000 mg | Freq: Four times a day (QID) | INTRAMUSCULAR | Status: DC | PRN
  Administered 2019-02-05: 4 mg via INTRAVENOUS
  Filled 2019-02-04: qty 2

## 2019-02-04 MED ORDER — ONDANSETRON HCL 4 MG/2ML IJ SOLN
4.0000 mg | Freq: Once | INTRAMUSCULAR | Status: AC
  Administered 2019-02-04: 4 mg via INTRAVENOUS
  Filled 2019-02-04: qty 2

## 2019-02-04 MED ORDER — ACETAMINOPHEN 500 MG PO TABS
1000.0000 mg | ORAL_TABLET | Freq: Once | ORAL | Status: AC
  Administered 2019-02-04: 1000 mg via ORAL
  Filled 2019-02-04: qty 2

## 2019-02-04 MED ORDER — ACETAMINOPHEN 650 MG RE SUPP: 650.0000 mg | Freq: Four times a day (QID) | RECTAL | Status: DC | PRN

## 2019-02-04 MED ORDER — IOHEXOL 350 MG/ML SOLN
100.0000 mL | Freq: Once | INTRAVENOUS | Status: AC | PRN
  Administered 2019-02-04: 100 mL via INTRAVENOUS

## 2019-02-04 MED ORDER — ALUM & MAG HYDROXIDE-SIMETH 200-200-20 MG/5ML PO SUSP
15.0000 mL | Freq: Once | ORAL | Status: AC
  Administered 2019-02-04: 15 mL via ORAL
  Filled 2019-02-04: qty 30

## 2019-02-04 NOTE — ED Provider Notes (Signed)
[facility] EMERGENCY DEPARTMENT Provider Note   CSN: [id number] Arrival date & time: 02/04/19  1527     History No chief complaint on file.   Donald Montgomery is a 74 y.o. male.  HPI 74 year old male presenting to the emergency department for epigastric abdominal pain.  Patient states that he woke up this morning with severe upper abdominal pain, worse with eating.  Patient states he tried Pepto-Bismol, Zantac and other over-the-counter meds without any relief.  Patient states it is a gnawing type pain in the epigastrium, denies history of peptic ulcer disease, pancreatitis or gallbladder disease, no recent surgeries, having normal bowel movements, no blood in the stool, no melena, no hematochezia, passing gas, patient states that he did have one episode of vomiting today, states that it looked like coffee however denies any blood in the vomit.  Also states that all he is eating today was coffee.  Denies any chest pain or shortness of breath, no fevers or chills, no cough, states that he has a constant nauseous feeling as well.    Past Medical History:  Diagnosis Date  . Cancer [facility])     Patient Active Problem List   Diagnosis Date Noted  . SBO (small bowel obstruction) ([facility]) 02/04/2019  . Chronic throat clearing 04/14/2016  . Chronic rhinitis 04/14/2016  . Mild acid reflux 04/14/2016  . Inguinal hernia, left 06/22/2013  . Testosterone deficiency 04/29/2013  . Erectile dysfunction 04/29/2013    Past Surgical History:  Procedure Laterality Date  . TONSILLECTOMY         Family History  Problem Relation Age of Onset  . Heart disease Mother   . Stroke Brother   . Heart disease Brother   . Allergic rhinitis Neg Hx   . Angioedema Neg Hx   . Asthma Neg Hx   . Eczema Neg Hx   . Immunodeficiency Neg Hx   . Urticaria Neg Hx     Social History   Tobacco Use  . Smoking status: Former Smoker    Packs/day: 18.00    Years: 1.00    Pack years: 18.00   Types: Cigarettes, Pipe  . Smokeless tobacco: Never Used  . Tobacco comment: will consider AAA check as he states he smoked over 100 cigerattes   Substance Use Topics  . Alcohol use: No  . Drug use: No    Home Medications Prior to Admission medications   Medication Sig Start Date End Date Taking? Authorizing Provider  ANDROGEL PUMP 20.25 MG/ACT (1.62%) GEL Apply 4 application topically See admin instructions. Apply 2 pumps to each shoulder once a day 11/28/11  Yes [provider]  b complex vitamins tablet Take 1 tablet by mouth daily with supper.   Yes [provider]  Black Elderberry (SAMBUCUS ELDERBERRY PO) Take 1 tablet by mouth daily.   Yes [provider]  Cholecalciferol (VITAMIN D-3) 25 MCG (1000 UT) CAPS Take 2,000 Units by mouth daily with supper.   Yes [provider]  Coenzyme Q10 (CO Q 10 PO) Take 1 capsule by mouth daily.   Yes [provider]  ibuprofen (ADVIL) 200 MG tablet Take 200 mg by mouth every 6 (six) hours as needed for headache or mild pain.   Yes [provider]  L-ARGININE PO Take 1 tablet by mouth daily with lunch.   Yes [provider]  MAGNESIUM PO Take 1 tablet by mouth daily with supper.   Yes [provider]  Omega-3 Fatty Acids (  FISH OIL) 1000 MG CAPS Take 1,000 mg by mouth daily with supper.    Yes [provider]  Probiotic Product (ALIGN) 4 MG CAPS Take 4 mg by mouth daily.   Yes [provider]  tadalafil (CIALIS) 5 MG tablet Take 10-15 mg by mouth daily as needed for erectile dysfunction.    Yes [provider]  Azelastine HCl 0.15 % SOLN Place 2 sprays into both nostrils 2 (two) times daily as needed. Patient not taking: Reported on 02/04/2019 04/14/16   Bobbitt, Sedalia Muta, MD  fluticasone [facility]) 50 MCG/ACT nasal spray Place 2 sprays into both nostrils 2 (two) times daily. Patient not taking: Reported on 02/04/2019 04/24/16   Bobbitt, Sedalia Muta, MD    meclizine (ANTIVERT) 25 MG tablet Take 1 tablet (25 mg total) by mouth 3 (three) times daily as needed for dizziness. Patient not taking: Reported on 02/04/2019 01/12/17   Billie Ruddy, MD  omeprazole (PRILOSEC) 20 MG capsule Take 1 capsule (20 mg total) by mouth daily before breakfast. 30 minutes prior to breakfast Patient not taking: Reported on 02/04/2019 04/14/16   Bobbitt, Sedalia Muta, MD  ranitidine (ZANTAC) 150 MG tablet Take 1 tablet (150 mg total) by mouth 2 (two) times daily. Patient not taking: Reported on 02/04/2019 04/24/16   Bobbitt, Sedalia Muta, MD    Allergies    Patient has no known allergies.  Review of Systems   Review of Systems  Constitutional: Negative for chills and fever.  HENT: Negative for ear pain and sore throat.   Eyes: Negative for pain and visual disturbance.  Respiratory: Negative for cough and shortness of breath.   Cardiovascular: Negative for chest pain and palpitations.  Gastrointestinal: Positive for abdominal pain, nausea and vomiting.  Genitourinary: Negative for dysuria and hematuria.  Musculoskeletal: Negative for arthralgias and back pain.  Skin: Negative for color change and rash.  Neurological: Negative for seizures and syncope.  All other systems reviewed and are negative.   Physical Exam Updated Vital Signs BP 127/77   Pulse 71   Temp 98.2 F (36.8 C) (Oral)   Resp 18   SpO2 94%   Physical Exam Vitals and nursing note reviewed.  Constitutional:      Appearance: He is well-developed.  HENT:     Head: Normocephalic and atraumatic.  Eyes:     Conjunctiva/sclera: Conjunctivae normal.  Cardiovascular:     Rate and Rhythm: Normal rate and regular rhythm.     Heart sounds: No murmur.  Pulmonary:     Effort: Pulmonary effort is normal. No respiratory distress.     Breath sounds: Normal breath sounds.  Abdominal:     General: There is no distension.     Palpations: Abdomen is soft. There is no mass.     Tenderness: There is no  abdominal tenderness. There is no guarding or rebound.     Hernia: No hernia is present.     Comments: Mild TTP epigastric, no rebound or guarding, no peritoneal signs  Musculoskeletal:     Cervical back: Neck supple.  Skin:    General: Skin is warm and dry.  Neurological:     Mental Status: He is alert.     ED Results / Procedures / Treatments   Labs (all labs ordered are listed, but only abnormal results are displayed) Labs Reviewed  CBC WITH DIFFERENTIAL/PLATELET - Abnormal; Notable for the following components:      Result Value   WBC 11.5 (*)    Neutro  Abs 10.1 (*)    Abs Immature Granulocytes 0.10 (*)    All other components within normal limits  COMPREHENSIVE METABOLIC PANEL - Abnormal; Notable for the following components:   Glucose, Bld 133 (*)    All other components within normal limits  RESPIRATORY PANEL BY RT PCR (FLU A&B, COVID)  LIPASE, BLOOD  LACTIC ACID, PLASMA  BASIC METABOLIC PANEL  CBC  TYPE AND SCREEN  ABO/RH    EKG EKG Interpretation  Date/Time:  Friday February 04 2019 18:32:02 EST Ventricular Rate:  71 PR Interval:    QRS Duration: 102 QT Interval:  371 QTC Calculation: 404 R Axis:   -6 Text Interpretation: Sinus rhythm RSR' in V1 or V2, right VCD or RVH Nonspecific T abnormalities, lateral leads No prior ECG for comparison. No STEMI Confirmed by Antony Blackbird [phone]) on 02/04/2019 10:42:51 PM   Radiology CT Angio Abdomen W and/or Wo Contrast  Result Date: 02/04/2019 CLINICAL DATA:  Mesenteric ischemia. Upper abdominal pain. EXAM: CT ANGIOGRAPHY ABDOMEN TECHNIQUE: Multidetector CT imaging of the abdomen was performed using the standard protocol during bolus administration of intravenous contrast. Multiplanar reconstructed images and MIPs were obtained and reviewed to evaluate the vascular anatomy. CONTRAST:  133mL OMNIPAQUE IOHEXOL 350 MG/ML SOLN COMPARISON:  None. FINDINGS: VASCULAR Aorta: Mild atherosclerosis. Normal in caliber. No  dissection, vasculitis, or significant stenosis. Celiac: Mild (less than 50%) stenosis at the origin due to the diaphragmatic crus. No significant poststenotic dilatation. Hepatic and splenic arteries are widely patent. No dissection or vasculitis. SMA: Widely patent without stenosis, dissection, or vasculitis. Mesenteric branch vessels are widely patent. No evidence of embolic disease. Renals: Single bilateral renal arteries are widely patent. Minimal plaque at the origin on the right. No significant stenosis, vasculitis, or dissection. IMA: Widely patent. Inflow: Widely patent with mild atherosclerosis. No dissection, significant stenosis or evidence of vasculitis. Veins: Venous phase imaging demonstrates patency of the portal and splenic veins. No evidence of mesenteric vein thrombosis. No evidence of acute venous abnormality. Review of the MIP images confirms the above findings. NON-VASCULAR Lower chest: Mild dependent atelectasis. No pleural fluid. Heart size is normal. Small hiatal hernia. Hepatobiliary: Arterial enhancement along the falciform ligament is likely an arterial portal shunt. There is no evidence of focal lesion. Multiple gallstones within physiologically distended gallbladder. No biliary dilatation. Pancreas: No ductal dilatation or inflammation. Spleen: Normal in size without focal abnormality. Adrenals/Urinary Tract: Normal adrenal glands. Parapelvic cysts in the left kidney versus mild left hydronephrosis. Parapelvic cysts are favored, as there is no ureteral dilatation. No perinephric edema of either kidney. Tiny hypodensity in the lower right kidney is too small to characterize but likely small cyst. No evidence of renal ureteral calculi. Urinary bladder is physiologically distended. No bladder wall thickening. Stomach/Bowel: Small hiatal hernia. Stomach is partially distended. Small duodenal diverticulum without inflammation. The proximal small bowel is nondilated. There is transition from  nondilated to dilated small bowel in the left upper quadrant, series 11, image 38. Dilated fluid-filled small bowel with distal transition point in the right lower quadrant, series 11, image 50. There is no pneumatosis. More distal small bowel is decompressed. Mild mesenteric edema and free fluid. High-riding cecum in the right upper quadrant. Normal appendix courses into the right upper quadrant. Colonic diverticulosis from the descending through the sigmoid colon. No evidence of diverticulitis. Lymphatic: No enlarged lymph nodes in the abdomen or pelvis. Reproductive: Prostate gland is normal in size. Other: Small volume abdominopelvic ascites. Mild mesenteric edema and mesenteric free fluid.  No free air or intra-abdominal abscess. Fat in both inguinal canals. Musculoskeletal: There are no acute or suspicious osseous abnormalities. Degenerative change in the lower lumbar spine. IMPRESSION: VASCULAR 1. No acute mesenteric vascular abnormality. Slight stenosis at the origin of the celiac artery due to the diaphragmatic crus, mesenteric vessels are otherwise widely patent. 2. Mild aortic atherosclerosis. NON-VASCULAR 1. Small bowel obstruction with contiguous segment of obstructed bowel with separate transition points, proximal transition point in left upper quadrant and distal transition point in the right lower quadrant. There is no pneumatosis or free air to suggest perforation, however there is moderate mesenteric edema in the right lower quadrant. Small volume abdominopelvic ascites. 2. Probable parapelvic cysts in the left kidney, less likely hydronephrosis. There is no ureteral dilatation or perinephric edema to suggest urinary obstruction. 3. Colonic diverticulosis without diverticulitis. 4. Incidental cholelithiasis and small hiatal hernia. Aortic Atherosclerosis (ICD10-I70.0). Electronically Signed   By: Keith Rake M.D.   On: 02/04/2019 19:55    Procedures Procedures (including critical care  time)  Medications Ordered in ED Medications  morphine 4 MG/ML injection 4 mg (4 mg Intravenous Not Given 02/04/19 1806)  enoxaparin (LOVENOX) injection 40 mg (has no administration in time range)  0.9 %  sodium chloride infusion (has no administration in time range)  acetaminophen (TYLENOL) tablet 650 mg (has no administration in time range)    Or  acetaminophen (TYLENOL) suppository 650 mg (has no administration in time range)  morphine 2 MG/ML injection 1 mg (has no administration in time range)  ondansetron (ZOFRAN-ODT) disintegrating tablet 4 mg (has no administration in time range)    Or  ondansetron (ZOFRAN) injection 4 mg (has no administration in time range)  simethicone (MYLICON) chewable tablet 40 mg (has no administration in time range)  ondansetron (ZOFRAN) injection 4 mg (4 mg Intravenous Given 02/04/19 1805)  lactated ringers bolus 1,000 mL (0 mLs Intravenous Stopped 02/04/19 2025)  alum & mag hydroxide-simeth (MAALOX/MYLANTA) 200-200-20 MG/5ML suspension 15 mL (15 mLs Oral Given 02/04/19 1752)  iohexol (OMNIPAQUE) 300 MG/ML solution 100 mL (100 mLs Intravenous Contrast Given 02/04/19 1837)  iohexol (OMNIPAQUE) 350 MG/ML injection 100 mL (100 mLs Intravenous Contrast Given 02/04/19 1844)  acetaminophen (TYLENOL) tablet 1,000 mg (1,000 mg Oral Given 02/04/19 1939)    ED Course  I have reviewed the triage vital signs and the nursing notes.  Pertinent labs & imaging results that were available during my care of the patient were reviewed by me and considered in my medical decision making (see chart for details).    MDM Rules/Calculators/A&P                     74 year old male presented with abdominal pain, nausea and vomiting.  On arrival patient was hemodynamically stable, afebrile, well-appearing but slightly uncomfortable secondary to pain.  Patient's abdomen is slightly distended but otherwise largely nontender, nonperitoneal.  Will obtain basic lab either small bowel  obstruction versus mesenteric ischemia versus pancreatitis or biliary pathology. Pending labs and imaging currently.   Labs reassuring, lipase and lactic acid normal, CBC and kidney function stable. No chest pain but given his location of pain and age will obtain screening EKG as well.   CT with SBO, multiple transition points. No vascular etiology. Surgery consulted. Surgery will admit.   The attending physician was present and available for all medical decision making and procedures related to this patient's care.   Final Clinical Impression(s) / ED Diagnoses Final diagnoses:  SBO (small bowel  obstruction) [facility])    Rx / DC Orders ED Discharge Orders    None       Kizzie Fantasia, MD 02/04/19 2247    Tegeler, Gwenyth Allegra, MD 02/05/19 1009

## 2019-02-04 NOTE — Progress Notes (Signed)
Virtual Visit via Telephone Note  I connected with Donald Montgomery on 02/04/19 at 10:30 AM EST by telephone and verified that I am speaking with the correct person using two identifiers.   I discussed the limitations, risks, security and privacy concerns of performing an evaluation and management service by telephone and the availability of in person appointments. I also discussed with the patient that there may be a patient responsible charge related to this service. The patient expressed understanding and agreed to proceed.  Location patient: home Location provider: work or home office Participants present for the call: patient, provider Patient did not have a visit in the prior 7 days to address this/these issue(s).   History of Present Illness: Pt is a 74 yo male with pmh sig for acid reflux, testosterone def, ED seen for acute concern.  Pt with upper abdominal pain after eating breakfast this am.  States pain is intense, occuring in waves, and noted as cramping.  Pt states still having pain after a BM.  Feels like gas pain.  Tried pepto-bismal, gas x, advil, and ginger.  Had brief nausea. Denies vomiting, fever, chills, constipation, diarrhea, back pain, CP, sick contacts.    Observations/Objective: Patient sounds cheerful and well on the phone. I do not appreciate any SOB. Speech and thought processing are grossly intact. Patient reported vitals:  Assessment and Plan: Pain of upper abdomen  -consider gallbladder dz, gastric ulcer, pancreatitis, gastroparesis, hepatic dysfunction, viral gastritis, diverticulitis -will obtain labs. -if needed will obtain imaging -for worsening symptoms proceed to ED. - Plan: CBC (no diff), Comprehensive metabolic panel, Lipase  Follow Up Instructions: F/u prn  I did not refer this patient for an OV in the next 24 hours for this/these issue(s).  I discussed the assessment and treatment plan with the patient. The patient was provided an  opportunity to ask questions and all were answered. The patient agreed with the plan and demonstrated an understanding of the instructions.   The patient was advised to call back or seek an in-person evaluation if the symptoms worsen or if the condition fails to improve as anticipated.  I provided 6 minutes of non-face-to-face time during this encounter.   Billie Ruddy, MD

## 2019-02-04 NOTE — H&P (Signed)
Donald Montgomery is an 74 y.o. male.   Chief Complaint: ab pain HPI: 70 yom who presents with upper abdominal pain. He has never had this before. 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  He woke up with it this am and worsened with eating. Tried otc meds no relief. Only got worse. Was associated with some n and emesis later around 1500.  He is not passing flatus but did have bm this am. He has no fever.  Now with decreased abd pain. He underwent a cta per er although has no real risk factors for mesenteric ischemia and this shows possible sbo. I was asked to see him  Past Medical History:  Diagnosis Date  . Cancer [facility])     Past Surgical History:  Procedure Laterality Date  . TONSILLECTOMY      Family History  Problem Relation Age of Onset  . Heart disease Mother   . Stroke Brother   . Heart disease Brother   . Allergic rhinitis Neg Hx   . Angioedema Neg Hx   . Asthma Neg Hx   . Eczema Neg Hx   . Immunodeficiency Neg Hx   . Urticaria Neg Hx    Social History:  reports that he has quit smoking. His smoking use included cigarettes and pipe. He has a 18.00 pack-year smoking history. He has never used smokeless tobacco. He reports that he does not drink alcohol or use drugs.  Allergies: No Known Allergies  meds none  Results for orders placed or performed during the hospital encounter of 02/04/19 (from the past 48 hour(s))  Lactic acid, plasma     Status: None   Collection Time: 02/04/19  6:15 PM  Result Value Ref Range   Lactic Acid, Venous 1.6 0.5 - 1.9 mmol/L    Comment: Performed at [facility], [address], [place], [place] [address]  CBC with Differential     Status: Abnormal   Collection Time: 02/04/19  6:16 PM  Result Value Ref Range   WBC 11.5 (H) 4.0 - 10.5 K/uL   RBC 5.29 4.22 - 5.81 MIL/uL   Hemoglobin 16.7 13.0 - 17.0 g/dL   HCT 50.3 39.0 - 52.0 %   MCV 95.1 80.0 - 100.0 fL   MCH 31.6 26.0 - 34.0 pg   MCHC 33.2 30.0 - 36.0 g/dL   RDW 12.3 11.5 - 15.5 %   Platelets  279 150 - 400 K/uL   nRBC 0.0 0.0 - 0.2 %   Neutrophils Relative % 88 %   Neutro Abs 10.1 (H) 1.7 - 7.7 K/uL   Lymphocytes Relative 7 %   Lymphs Abs 0.8 0.7 - 4.0 K/uL   Monocytes Relative 4 %   Monocytes Absolute 0.5 0.1 - 1.0 K/uL   Eosinophils Relative 0 %   Eosinophils Absolute 0.0 0.0 - 0.5 K/uL   Basophils Relative 0 %   Basophils Absolute 0.0 0.0 - 0.1 K/uL   Immature Granulocytes 1 %   Abs Immature Granulocytes 0.10 (H) 0.00 - 0.07 K/uL    Comment: Performed at [facility] [address], [place], [place] [address]  Comprehensive metabolic panel     Status: Abnormal   Collection Time: 02/04/19  6:16 PM  Result Value Ref Range   Sodium 137 135 - 145 mmol/L   Potassium 4.3 3.5 - 5.1 mmol/L   Chloride 100 98 - 111 mmol/L   CO2 26 22 - 32 mmol/L   Glucose, Bld 133 (H) 70 -  99 mg/dL   BUN 19 8 - 23 mg/dL   Creatinine, Ser 0.97 0.61 - 1.24 mg/dL   Calcium 10.1 8.9 - 10.3 mg/dL   Total Protein 7.7 6.5 - 8.1 g/dL   Albumin 4.4 3.5 - 5.0 g/dL   AST 25 15 - 41 U/L   ALT 23 0 - 44 U/L   Alkaline Phosphatase 63 38 - 126 U/L   Total Bilirubin 0.8 0.3 - 1.2 mg/dL   GFR calc non Af Amer >60 >60 mL/min   GFR calc Af Amer >60 >60 mL/min   Anion gap 11 5 - 15    Comment: Performed at [facility], [place] [facility] [place] [address] [place], [place] [address]  Lipase, blood     Status: None   Collection Time: 02/04/19  6:16 PM  Result Value Ref Range   Lipase 30 11 - 51 U/L    Comment: Performed at [facility] [address], [place], [place] [address]  Type and screen [place]     Status: None   Collection Time: 02/04/19  6:16 PM  Result Value Ref Range   ABO/RH(D) O POS    Antibody Screen NEG    Sample Expiration      02/07/2019,2359 Performed at [facility], [place] [address], [place], [place] [address]   ABO/Rh     Status: None   Collection Time: 02/04/19  6:16 PM  Result Value Ref Range   ABO/RH(D)      O POS Performed at [facility] [address] [place], [place] [address]    CT Angio Abdomen W and/or Wo Contrast  Result Date: 02/04/2019 CLINICAL DATA:  Mesenteric ischemia. Upper abdominal pain. EXAM: CT ANGIOGRAPHY ABDOMEN TECHNIQUE: Multidetector CT imaging of the abdomen was performed using the standard protocol during bolus administration of intravenous contrast. Multiplanar reconstructed images and MIPs were obtained and reviewed to evaluate the vascular anatomy. CONTRAST:  169mL OMNIPAQUE IOHEXOL 350 MG/ML SOLN COMPARISON:  None. FINDINGS: VASCULAR Aorta: Mild atherosclerosis. Normal in caliber. No dissection, vasculitis, or significant stenosis. Celiac: Mild (less than 50%) stenosis at the origin due to the diaphragmatic crus. No significant poststenotic dilatation. Hepatic and splenic arteries are widely patent. No dissection or vasculitis. SMA: Widely patent without stenosis, dissection, or vasculitis. Mesenteric branch vessels are widely patent. No evidence of embolic disease. Renals: Single bilateral renal arteries are widely patent. Minimal plaque at the origin on the right. No significant stenosis, vasculitis, or dissection. IMA: Widely patent. Inflow: Widely patent with mild atherosclerosis. No dissection, significant stenosis or evidence of vasculitis. Veins: Venous phase imaging demonstrates patency of the portal and splenic veins. No evidence of mesenteric vein thrombosis. No evidence of acute venous abnormality. Review of the MIP images confirms the above findings. NON-VASCULAR Lower chest: Mild dependent atelectasis. No pleural fluid. Heart size is normal. Small hiatal hernia. Hepatobiliary: Arterial enhancement along the falciform ligament is likely an arterial portal shunt. There is no evidence of focal lesion. Multiple gallstones within physiologically distended gallbladder. No biliary dilatation. Pancreas: No ductal dilatation or inflammation. Spleen: Normal in size without focal abnormality. Adrenals/Urinary  Tract: Normal adrenal glands. Parapelvic cysts in the left kidney versus mild left hydronephrosis. Parapelvic cysts are favored, as there is no ureteral dilatation. No perinephric edema of either kidney. Tiny hypodensity in the lower right kidney is too small to characterize but likely small cyst. No evidence of renal ureteral calculi. Urinary bladder is physiologically distended. No bladder  wall thickening. Stomach/Bowel: Small hiatal hernia. Stomach is partially distended. Small duodenal diverticulum without inflammation. The proximal small bowel is nondilated. There is transition from nondilated to dilated small bowel in the left upper quadrant, series 11, image 38. Dilated fluid-filled small bowel with distal transition point in the right lower quadrant, series 11, image 50. There is no pneumatosis. More distal small bowel is decompressed. Mild mesenteric edema and free fluid. High-riding cecum in the right upper quadrant. Normal appendix courses into the right upper quadrant. Colonic diverticulosis from the descending through the sigmoid colon. No evidence of diverticulitis. Lymphatic: No enlarged lymph nodes in the abdomen or pelvis. Reproductive: Prostate gland is normal in size. Other: Small volume abdominopelvic ascites. Mild mesenteric edema and mesenteric free fluid. No free air or intra-abdominal abscess. Fat in both inguinal canals. Musculoskeletal: There are no acute or suspicious osseous abnormalities. Degenerative change in the lower lumbar spine. IMPRESSION: VASCULAR 1. No acute mesenteric vascular abnormality. Slight stenosis at the origin of the celiac artery due to the diaphragmatic crus, mesenteric vessels are otherwise widely patent. 2. Mild aortic atherosclerosis. NON-VASCULAR 1. Small bowel obstruction with contiguous segment of obstructed bowel with separate transition points, proximal transition point in left upper quadrant and distal transition point in the right lower quadrant. There is  no pneumatosis or free air to suggest perforation, however there is moderate mesenteric edema in the right lower quadrant. Small volume abdominopelvic ascites. 2. Probable parapelvic cysts in the left kidney, less likely hydronephrosis. There is no ureteral dilatation or perinephric edema to suggest urinary obstruction. 3. Colonic diverticulosis without diverticulitis. 4. Incidental cholelithiasis and small hiatal hernia. Aortic Atherosclerosis (ICD10-I70.0). Electronically Signed   By: Keith Rake M.D.   On: 02/04/2019 19:55    Review of Systems  Constitutional: Negative for fever.  Respiratory: Negative for cough and shortness of breath.   Cardiovascular: Negative for chest pain.  Gastrointestinal: Positive for abdominal pain, constipation, nausea and vomiting. Negative for blood in stool.  All other systems reviewed and are negative.   Blood pressure 120/75, pulse 70, temperature 98.2 F (36.8 C), temperature source Oral, resp. rate 16, SpO2 96 %. Physical Exam  Vitals reviewed. Constitutional: He is oriented to person, place, and time. He appears well-developed and well-nourished.  HENT:  Head: Normocephalic and atraumatic.  Right Ear: External ear normal.  Left Ear: External ear normal.  Eyes: No scleral icterus.  Cardiovascular: Normal rate, regular rhythm, normal heart sounds and intact distal pulses.  Respiratory: Effort normal and breath sounds normal.  GI: Soft. He exhibits no distension. Bowel sounds are decreased. There is abdominal tenderness (mild) in the epigastric area. No hernia.    Lymphadenopathy:    He has no cervical adenopathy.  Neurological: He is alert and oriented to person, place, and time.  Skin: Skin is warm and dry.  Psychiatric: He has a normal mood and affect. His behavior is normal.     Assessment/Plan SBO -no prior surgery, does not need operative intervention pretty benign right now -will admit, ng tube if n/v he is soft and has no n/v  now -repeat xrays in am -npo -discussed role of surgery for worsening or not improving -consider contrast study in am if not better -lovenox scds Rolm Bookbinder, MD 02/04/2019, 8:54 PM

## 2019-02-04 NOTE — ED Triage Notes (Addendum)
Per pt: He has "pretty severe upper abdominal pain", pt points to epigastric area. Pt states that nothing makes it better. Pt states that he tried pepto bismal, zantac, and other OTC meds without relief. Pt had blood work drawn this AM, results are in computer. Pt had moderna shot for COVID on Tuesday.

## 2019-02-04 NOTE — Plan of Care (Signed)
  Problem: Safety: Goal: Ability to remain free from injury will improve Outcome: Progressing   Problem: Pain Managment: Goal: General experience of comfort will improve Outcome: Progressing   Problem: Skin Integrity: Goal: Risk for impaired skin integrity will decrease Outcome: Progressing   

## 2019-02-04 NOTE — Telephone Encounter (Signed)
Pt called to review lab results, however no answer.  Left VM.  Upon further chart review it appears pt is in the ED.  Grier Mitts, MD

## 2019-02-04 NOTE — ED Notes (Signed)
Pt transported to CT ?

## 2019-02-05 ENCOUNTER — Inpatient Hospital Stay (HOSPITAL_COMMUNITY): Payer: Medicare Other

## 2019-02-05 LAB — CBC
HCT: 43.9 % (ref 39.0–52.0)
Hemoglobin: 14.9 g/dL (ref 13.0–17.0)
MCH: 31.9 pg (ref 26.0–34.0)
MCHC: 33.9 g/dL (ref 30.0–36.0)
MCV: 94 fL (ref 80.0–100.0)
Platelets: 258 10*3/uL (ref 150–400)
RBC: 4.67 MIL/uL (ref 4.22–5.81)
RDW: 12.5 % (ref 11.5–15.5)
WBC: 11.2 10*3/uL — ABNORMAL HIGH (ref 4.0–10.5)
nRBC: 0 % (ref 0.0–0.2)

## 2019-02-05 LAB — BASIC METABOLIC PANEL
Anion gap: 9 (ref 5–15)
BUN: 16 mg/dL (ref 8–23)
CO2: 25 mmol/L (ref 22–32)
Calcium: 8.9 mg/dL (ref 8.9–10.3)
Chloride: 103 mmol/L (ref 98–111)
Creatinine, Ser: 0.88 mg/dL (ref 0.61–1.24)
GFR calc Af Amer: >60 mL/min (ref >60)
GFR calc non Af Amer: >60 mL/min (ref >60)
Glucose, Bld: 117 mg/dL — ABNORMAL HIGH (ref 70–99)
Potassium: 4 mmol/L (ref 3.5–5.1)
Sodium: 137 mmol/L (ref 135–145)

## 2019-02-05 MED ORDER — DIATRIZOATE MEGLUMINE & SODIUM 66-10 % PO SOLN
90.0000 mL | Freq: Once | ORAL | Status: AC
  Administered 2019-02-05: 90 mL via ORAL
  Filled 2019-02-05: qty 90

## 2019-02-05 NOTE — Plan of Care (Signed)

## 2019-02-05 NOTE — Progress Notes (Signed)
Pt. ambulated 3 laps in the hall. Pt. tolerated ambulation well and has no complaints at this time. Will continue to monitor.

## 2019-02-05 NOTE — Progress Notes (Signed)
Patient ID: Donald Montgomery, male   DOB: April 18, 1945, 74 y.o.   MRN: [id number] [name] Surgery Progress Note:   * No surgery found *  Subjective: Mental status is clear;  Pain much less than yesterday Objective: Vital signs in last 24 hours: Temp:  [98.2 F (36.8 C)-98.9 F (37.2 C)] 98.7 F (37.1 C) (01/30 0448) Pulse Rate:  [69-77] 70 (01/30 0448) Resp:  [16-22] 18 (01/30 0448) BP: (109-133)/(68-78) 109/70 (01/30 0448) SpO2:  [94 %-97 %] 94 % (01/30 0448)  Intake/Output from previous day: 01/29 0701 - 01/30 0700 In: 1411.1 [I.V.:411.1; IV Piggyback:1000] Out: -  Intake/Output this shift: No intake/output data recorded.  Physical Exam: Work of breathing is normal.  Abdomen is soft and nontender to palpation.  No palpable right or recurrent left inguinal hernia  Lab Results:  Results for orders placed or performed during the hospital encounter of 02/04/19 (from the past 48 hour(s))  Lactic acid, plasma     Status: None   Collection Time: 02/04/19  6:15 PM  Result Value Ref Range   Lactic Acid, Venous 1.6 0.5 - 1.9 mmol/L    Comment: Performed at [facility], [address], [place], [place] [address]  CBC with Differential     Status: Abnormal   Collection Time: 02/04/19  6:16 PM  Result Value Ref Range   WBC 11.5 (H) 4.0 - 10.5 K/uL   RBC 5.29 4.22 - 5.81 MIL/uL   Hemoglobin 16.7 13.0 - 17.0 g/dL   HCT 50.3 39.0 - 52.0 %   MCV 95.1 80.0 - 100.0 fL   MCH 31.6 26.0 - 34.0 pg   MCHC 33.2 30.0 - 36.0 g/dL   RDW 12.3 11.5 - 15.5 %   Platelets 279 150 - 400 K/uL   nRBC 0.0 0.0 - 0.2 %   Neutrophils Relative % 88 %   Neutro Abs 10.1 (H) 1.7 - 7.7 K/uL   Lymphocytes Relative 7 %   Lymphs Abs 0.8 0.7 - 4.0 K/uL   Monocytes Relative 4 %   Monocytes Absolute 0.5 0.1 - 1.0 K/uL   Eosinophils Relative 0 %   Eosinophils Absolute 0.0 0.0 - 0.5 K/uL   Basophils Relative 0 %   Basophils Absolute 0.0 0.0 - 0.1 K/uL   Immature Granulocytes 1 %   Abs Immature  Granulocytes 0.10 (H) 0.00 - 0.07 K/uL    Comment: Performed at [facility] [address], [place], [place] [address]  Comprehensive metabolic panel     Status: Abnormal   Collection Time: 02/04/19  6:16 PM  Result Value Ref Range   Sodium 137 135 - 145 mmol/L   Potassium 4.3 3.5 - 5.1 mmol/L   Chloride 100 98 - 111 mmol/L   CO2 26 22 - 32 mmol/L   Glucose, Bld 133 (H) 70 - 99 mg/dL   BUN 19 8 - 23 mg/dL   Creatinine, Ser 0.97 0.61 - 1.24 mg/dL   Calcium 10.1 8.9 - 10.3 mg/dL   Total Protein 7.7 6.5 - 8.1 g/dL   Albumin 4.4 3.5 - 5.0 g/dL   AST 25 15 - 41 U/L   ALT 23 0 - 44 U/L   Alkaline Phosphatase 63 38 - 126 U/L   Total Bilirubin 0.8 0.3 - 1.2 mg/dL   GFR calc non Af Amer >60 >60 mL/min   GFR calc Af Amer >60 >60 mL/min   Anion gap 11 5 - 15    Comment: Performed at  [facility], [place] [address], [place], [place] [address]  Lipase, blood     Status: None   Collection Time: 02/04/19  6:16 PM  Result Value Ref Range   Lipase 30 11 - 51 U/L    Comment: Performed at [facility] [address] [place], [place] [address]  Type and screen [place]     Status: None   Collection Time: 02/04/19  6:16 PM  Result Value Ref Range   ABO/RH(D) O POS    Antibody Screen NEG    Sample Expiration      02/07/2019,2359 Performed at [facility], [place] [address], [place], [place] [address]   ABO/Rh     Status: None   Collection Time: 02/04/19  6:16 PM  Result Value Ref Range   ABO/RH(D)      O POS Performed at [facility] [address] [place], [place] [address]   Respiratory Panel by RT PCR (Flu A&B, Covid) - Nasopharyngeal Swab     Status: None   Collection Time: 02/04/19  8:20 PM   Specimen: Nasopharyngeal Swab  Result Value Ref Range   SARS Coronavirus 2 by RT PCR NEGATIVE NEGATIVE    Comment: (NOTE) SARS-CoV-2 target nucleic acids are NOT DETECTED. The SARS-CoV-2 RNA is generally detectable in upper respiratoy specimens  during the acute phase of infection. The lowest concentration of SARS-CoV-2 viral copies this assay can detect is 131 copies/mL. A negative result does not preclude SARS-Cov-2 infection and should not be used as the sole basis for treatment or other patient management decisions. A negative result may occur with  improper specimen collection/handling, submission of specimen other than nasopharyngeal swab, presence of viral mutation(s) within the areas targeted by this assay, and inadequate number of viral copies (<131 copies/mL). A negative result must be combined with clinical observations, patient history, and epidemiological information. The expected result is Negative. Fact Sheet for Patients:  PinkCheek.be Fact Sheet for Healthcare Providers:  GravelBags.it This test is not yet ap proved or cleared by the [place] FDA and  has been authorized for detection and/or diagnosis of SARS-CoV-2 by FDA under an Emergency Use Authorization (EUA). This EUA will remain  in effect (meaning this test can be used) for the duration of the COVID-19 declaration under Section 564(b)(1) of the Act, 21 U.S.C. section 360bbb-3(b)(1), unless the authorization is terminated or revoked sooner.    Influenza A by PCR NEGATIVE NEGATIVE   Influenza B by PCR NEGATIVE NEGATIVE    Comment: (NOTE) The Xpert Xpress SARS-CoV-2/FLU/RSV assay is intended as an aid in  the diagnosis of influenza from Nasopharyngeal swab specimens and  should not be used as a sole basis for treatment. Nasal washings and  aspirates are unacceptable for Xpert Xpress SARS-CoV-2/FLU/RSV  testing. Fact Sheet for Patients: PinkCheek.be Fact Sheet for Healthcare Providers: GravelBags.it This test is not yet approved or cleared by the [place] FDA and  has been authorized for detection and/or diagnosis of SARS-CoV-2  by  FDA under an Emergency Use Authorization (EUA). This EUA will remain  in effect (meaning this test can be used) for the duration of the  Covid-19 declaration under Section 564(b)(1) of the Act, 21  U.S.C. section 360bbb-3(b)(1), unless the authorization is  terminated or revoked. Performed at [facility], [place] [address], [place], [place] [id number]   Basic metabolic panel     Status: Abnormal   Collection Time: 02/05/19  3:45 AM  Result Value Ref Range   Sodium 137 135 - 145 mmol/L   Potassium 4.0 3.5 - 5.1 mmol/L   Chloride 103 98 - 111 mmol/L   CO2 25 22 - 32 mmol/L   Glucose, Bld 117 (H) 70 - 99 mg/dL   BUN 16 8 - 23 mg/dL   Creatinine, Ser 0.88 0.61 - 1.24 mg/dL   Calcium 8.9 8.9 - 10.3 mg/dL   GFR calc non Af Amer >60 >60 mL/min   GFR calc Af Amer >60 >60 mL/min   Anion gap 9 5 - 15    Comment: Performed at [facility] [address], [place], [place] [address]  CBC     Status: Abnormal   Collection Time: 02/05/19  3:45 AM  Result Value Ref Range   WBC 11.2 (H) 4.0 - 10.5 K/uL   RBC 4.67 4.22 - 5.81 MIL/uL   Hemoglobin 14.9 13.0 - 17.0 g/dL   HCT 43.9 39.0 - 52.0 %   MCV 94.0 80.0 - 100.0 fL   MCH 31.9 26.0 - 34.0 pg   MCHC 33.9 30.0 - 36.0 g/dL   RDW 12.5 11.5 - 15.5 %   Platelets 258 150 - 400 K/uL   nRBC 0.0 0.0 - 0.2 %    Comment: Performed at [facility], [place] [address], [place], [place] [address]    Radiology/Results: CT Angio Abdomen W and/or Wo Contrast  Result Date: 02/04/2019 CLINICAL DATA:  Mesenteric ischemia. Upper abdominal pain. EXAM: CT ANGIOGRAPHY ABDOMEN TECHNIQUE: Multidetector CT imaging of the abdomen was performed using the standard protocol during bolus administration of intravenous contrast. Multiplanar reconstructed images and MIPs were obtained and reviewed to evaluate the vascular anatomy. CONTRAST:  111mL OMNIPAQUE IOHEXOL 350 MG/ML SOLN COMPARISON:  None. FINDINGS: VASCULAR Aorta: Mild atherosclerosis. Normal in  caliber. No dissection, vasculitis, or significant stenosis. Celiac: Mild (less than 50%) stenosis at the origin due to the diaphragmatic crus. No significant poststenotic dilatation. Hepatic and splenic arteries are widely patent. No dissection or vasculitis. SMA: Widely patent without stenosis, dissection, or vasculitis. Mesenteric branch vessels are widely patent. No evidence of embolic disease. Renals: Single bilateral renal arteries are widely patent. Minimal plaque at the origin on the right. No significant stenosis, vasculitis, or dissection. IMA: Widely patent. Inflow: Widely patent with mild atherosclerosis. No dissection, significant stenosis or evidence of vasculitis. Veins: Venous phase imaging demonstrates patency of the portal and splenic veins. No evidence of mesenteric vein thrombosis. No evidence of acute venous abnormality. Review of the MIP images confirms the above findings. NON-VASCULAR Lower chest: Mild dependent atelectasis. No pleural fluid. Heart size is normal. Small hiatal hernia. Hepatobiliary: Arterial enhancement along the falciform ligament is likely an arterial portal shunt. There is no evidence of focal lesion. Multiple gallstones within physiologically distended gallbladder. No biliary dilatation. Pancreas: No ductal dilatation or inflammation. Spleen: Normal in size without focal abnormality. Adrenals/Urinary Tract: Normal adrenal glands. Parapelvic cysts in the left kidney versus mild left hydronephrosis. Parapelvic cysts are favored, as there is no ureteral dilatation. No perinephric edema of either kidney. Tiny hypodensity in the lower right kidney is too small to characterize but likely small cyst. No evidence of renal ureteral calculi. Urinary bladder is physiologically distended. No bladder wall thickening. Stomach/Bowel: Small hiatal hernia. Stomach is partially distended. Small duodenal diverticulum without inflammation. The proximal small bowel is nondilated. There is  transition from nondilated to dilated small bowel in the left upper quadrant, series 11, image  38. Dilated fluid-filled small bowel with distal transition point in the right lower quadrant, series 11, image 50. There is no pneumatosis. More distal small bowel is decompressed. Mild mesenteric edema and free fluid. High-riding cecum in the right upper quadrant. Normal appendix courses into the right upper quadrant. Colonic diverticulosis from the descending through the sigmoid colon. No evidence of diverticulitis. Lymphatic: No enlarged lymph nodes in the abdomen or pelvis. Reproductive: Prostate gland is normal in size. Other: Small volume abdominopelvic ascites. Mild mesenteric edema and mesenteric free fluid. No free air or intra-abdominal abscess. Fat in both inguinal canals. Musculoskeletal: There are no acute or suspicious osseous abnormalities. Degenerative change in the lower lumbar spine. IMPRESSION: VASCULAR 1. No acute mesenteric vascular abnormality. Slight stenosis at the origin of the celiac artery due to the diaphragmatic crus, mesenteric vessels are otherwise widely patent. 2. Mild aortic atherosclerosis. NON-VASCULAR 1. Small bowel obstruction with contiguous segment of obstructed bowel with separate transition points, proximal transition point in left upper quadrant and distal transition point in the right lower quadrant. There is no pneumatosis or free air to suggest perforation, however there is moderate mesenteric edema in the right lower quadrant. Small volume abdominopelvic ascites. 2. Probable parapelvic cysts in the left kidney, less likely hydronephrosis. There is no ureteral dilatation or perinephric edema to suggest urinary obstruction. 3. Colonic diverticulosis without diverticulitis. 4. Incidental cholelithiasis and small hiatal hernia. Aortic Atherosclerosis (ICD10-I70.0). Electronically Signed   By: Keith Rake M.D.   On: 02/04/2019 19:55   DG Abd Portable 2V  Result Date:  02/05/2019 CLINICAL DATA:  Epigastric pain.  Evaluate bowel obstruction. EXAM: PORTABLE ABDOMEN - 2 VIEW COMPARISON:  CT abdomen 02/04/2019 FINDINGS: Multiple gaseous distended loops of small bowel are demonstrated throughout the abdomen measuring up to approximately 4 cm. There are differential air-fluid levels demonstrated on upright images. No definite free intraperitoneal air. Lung bases are clear. Calcified stones within the gallbladder. Lumbar spine degenerative changes. IMPRESSION: Findings compatible with small-bowel obstruction. Electronically Signed   By: Lovey Newcomer M.D.   On: 02/05/2019 07:00    Anti-infectives: Anti-infectives (From admission, onward)   None      Assessment/Plan: Problem List: Patient Active Problem List   Diagnosis Date Noted  . SBO (small bowel obstruction) ([place]) 02/04/2019  . Chronic throat clearing 04/14/2016  . Chronic rhinitis 04/14/2016  . Mild acid reflux 04/14/2016  . Inguinal hernia, left 06/22/2013  . Testosterone deficiency 04/29/2013  . Erectile dysfunction 04/29/2013    Mr. Tachibana looks much better than his xrays.  Will do the small bowel protocol without NG and to drink 90 cc gastrograffin and then xray this afternoon.   * No surgery found *    LOS: 1 day   Matt B. Hassell Done, MD, [facility] [phone] beeper [phone]  02/05/2019 8:59 AM

## 2019-02-06 LAB — CBC
HCT: 42.4 % (ref 39.0–52.0)
Hemoglobin: 13.8 g/dL (ref 13.0–17.0)
MCH: 31.5 pg (ref 26.0–34.0)
MCHC: 32.5 g/dL (ref 30.0–36.0)
MCV: 96.8 fL (ref 80.0–100.0)
Platelets: 233 10*3/uL (ref 150–400)
RBC: 4.38 MIL/uL (ref 4.22–5.81)
RDW: 12.6 % (ref 11.5–15.5)
WBC: 7 10*3/uL (ref 4.0–10.5)
nRBC: 0 % (ref 0.0–0.2)

## 2019-02-06 LAB — COMPREHENSIVE METABOLIC PANEL
ALT: 17 U/L (ref 0–44)
AST: 22 U/L (ref 15–41)
Albumin: 3.1 g/dL — ABNORMAL LOW (ref 3.5–5.0)
Alkaline Phosphatase: 43 U/L (ref 38–126)
Anion gap: 9 (ref 5–15)
BUN: 17 mg/dL (ref 8–23)
CO2: 26 mmol/L (ref 22–32)
Calcium: 8.4 mg/dL — ABNORMAL LOW (ref 8.9–10.3)
Chloride: 106 mmol/L (ref 98–111)
Creatinine, Ser: 1.01 mg/dL (ref 0.61–1.24)
GFR calc Af Amer: >60 mL/min (ref >60)
GFR calc non Af Amer: >60 mL/min (ref >60)
Glucose, Bld: 92 mg/dL (ref 70–99)
Potassium: 4.3 mmol/L (ref 3.5–5.1)
Sodium: 141 mmol/L (ref 135–145)
Total Bilirubin: 1 mg/dL (ref 0.3–1.2)
Total Protein: 5.8 g/dL — ABNORMAL LOW (ref 6.5–8.1)

## 2019-02-06 LAB — LIPASE, BLOOD: Lipase: 25 U/L (ref 11–51)

## 2019-02-06 NOTE — Progress Notes (Signed)
Patient ID: Donald Montgomery, male   DOB: 02-11-1945, 74 y.o.   MRN: [id number] [facility] Surgery Progress Note:   * No surgery found *  Subjective: Mental status is clear.  He states that he had the COVID vaccine on Tuesday.   Objective: Vital signs in last 24 hours: Temp:  [97.9 F (36.6 C)-98.4 F (36.9 C)] 98 F (36.7 C) (01/31 0609) Pulse Rate:  [61-65] 61 (01/31 0609) Resp:  [18] 18 (01/31 0609) BP: (120-126)/(69-73) 121/69 (01/31 0609) SpO2:  [95 %-100 %] 100 % (01/31 0609)  Intake/Output from previous day: No intake/output data recorded. Intake/Output this shift: No intake/output data recorded.  Physical Exam: Work of breathing is normal.  Abdomen is soft and he has had multiple BMs and gas since yesterday when the gastrograffin was in his colon.    Lab Results:  Results for orders placed or performed during the hospital encounter of 02/04/19 (from the past 48 hour(s))  Lactic acid, plasma     Status: None   Collection Time: 02/04/19  6:15 PM  Result Value Ref Range   Lactic Acid, Venous 1.6 0.5 - 1.9 mmol/L    Comment: Performed at [facility], [address], [place], [place] [address]  CBC with Differential     Status: Abnormal   Collection Time: 02/04/19  6:16 PM  Result Value Ref Range   WBC 11.5 (H) 4.0 - 10.5 K/uL   RBC 5.29 4.22 - 5.81 MIL/uL   Hemoglobin 16.7 13.0 - 17.0 g/dL   HCT 50.3 39.0 - 52.0 %   MCV 95.1 80.0 - 100.0 fL   MCH 31.6 26.0 - 34.0 pg   MCHC 33.2 30.0 - 36.0 g/dL   RDW 12.3 11.5 - 15.5 %   Platelets 279 150 - 400 K/uL   nRBC 0.0 0.0 - 0.2 %   Neutrophils Relative % 88 %   Neutro Abs 10.1 (H) 1.7 - 7.7 K/uL   Lymphocytes Relative 7 %   Lymphs Abs 0.8 0.7 - 4.0 K/uL   Monocytes Relative 4 %   Monocytes Absolute 0.5 0.1 - 1.0 K/uL   Eosinophils Relative 0 %   Eosinophils Absolute 0.0 0.0 - 0.5 K/uL   Basophils Relative 0 %   Basophils Absolute 0.0 0.0 - 0.1 K/uL   Immature Granulocytes 1 %   Abs Immature Granulocytes  0.10 (H) 0.00 - 0.07 K/uL    Comment: Performed at [facility] [address] [place], [place] [address]  Comprehensive metabolic panel     Status: Abnormal   Collection Time: 02/04/19  6:16 PM  Result Value Ref Range   Sodium 137 135 - 145 mmol/L   Potassium 4.3 3.5 - 5.1 mmol/L   Chloride 100 98 - 111 mmol/L   CO2 26 22 - 32 mmol/L   Glucose, Bld 133 (H) 70 - 99 mg/dL   BUN 19 8 - 23 mg/dL   Creatinine, Ser 0.97 0.61 - 1.24 mg/dL   Calcium 10.1 8.9 - 10.3 mg/dL   Total Protein 7.7 6.5 - 8.1 g/dL   Albumin 4.4 3.5 - 5.0 g/dL   AST 25 15 - 41 U/L   ALT 23 0 - 44 U/L   Alkaline Phosphatase 63 38 - 126 U/L   Total Bilirubin 0.8 0.3 - 1.2 mg/dL   GFR calc non Af Amer >60 >60 mL/min   GFR calc Af Amer >60 >60 mL/min   Anion gap 11 5 - 15  Comment: Performed at [facility], [place] [address], [place], [place] [address]  Lipase, blood     Status: None   Collection Time: 02/04/19  6:16 PM  Result Value Ref Range   Lipase 30 11 - 51 U/L    Comment: Performed at [facility] [address] [place], [place] [address]  Type and screen [place]     Status: None   Collection Time: 02/04/19  6:16 PM  Result Value Ref Range   ABO/RH(D) O POS    Antibody Screen NEG    Sample Expiration      02/07/2019,2359 Performed at [facility], [place] [address], [place], [place] [address]   ABO/Rh     Status: None   Collection Time: 02/04/19  6:16 PM  Result Value Ref Range   ABO/RH(D)      O POS Performed at [facility] [address] [place], [place] [address]   Respiratory Panel by RT PCR (Flu A&B, Covid) - Nasopharyngeal Swab     Status: None   Collection Time: 02/04/19  8:20 PM   Specimen: Nasopharyngeal Swab  Result Value Ref Range   SARS Coronavirus 2 by RT PCR NEGATIVE NEGATIVE    Comment: (NOTE) SARS-CoV-2 target nucleic acids are NOT DETECTED. The SARS-CoV-2 RNA is generally detectable in upper respiratoy specimens during the  acute phase of infection. The lowest concentration of SARS-CoV-2 viral copies this assay can detect is 131 copies/mL. A negative result does not preclude SARS-Cov-2 infection and should not be used as the sole basis for treatment or other patient management decisions. A negative result may occur with  improper specimen collection/handling, submission of specimen other than nasopharyngeal swab, presence of viral mutation(s) within the areas targeted by this assay, and inadequate number of viral copies (<131 copies/mL). A negative result must be combined with clinical observations, patient history, and epidemiological information. The expected result is Negative. Fact Sheet for Patients:  PinkCheek.be Fact Sheet for Healthcare Providers:  GravelBags.it This test is not yet ap proved or cleared by the [place] FDA and  has been authorized for detection and/or diagnosis of SARS-CoV-2 by FDA under an Emergency Use Authorization (EUA). This EUA will remain  in effect (meaning this test can be used) for the duration of the COVID-19 declaration under Section 564(b)(1) of the Act, 21 U.S.C. section 360bbb-3(b)(1), unless the authorization is terminated or revoked sooner.    Influenza A by PCR NEGATIVE NEGATIVE   Influenza B by PCR NEGATIVE NEGATIVE    Comment: (NOTE) The Xpert Xpress SARS-CoV-2/FLU/RSV assay is intended as an aid in  the diagnosis of influenza from Nasopharyngeal swab specimens and  should not be used as a sole basis for treatment. Nasal washings and  aspirates are unacceptable for Xpert Xpress SARS-CoV-2/FLU/RSV  testing. Fact Sheet for Patients: PinkCheek.be Fact Sheet for Healthcare Providers: GravelBags.it This test is not yet approved or cleared by the [place] FDA and  has been authorized for detection and/or diagnosis of SARS-CoV-2 by  FDA  under an Emergency Use Authorization (EUA). This EUA will remain  in effect (meaning this test can be used) for the duration of the  Covid-19 declaration under Section 564(b)(1) of the Act, 21  U.S.C. section 360bbb-3(b)(1), unless the authorization is  terminated or revoked. Performed at [facility], [place] [address], [place], [place] [id number]   Basic metabolic panel     Status: Abnormal   Collection  Time: 02/05/19  3:45 AM  Result Value Ref Range   Sodium 137 135 - 145 mmol/L   Potassium 4.0 3.5 - 5.1 mmol/L   Chloride 103 98 - 111 mmol/L   CO2 25 22 - 32 mmol/L   Glucose, Bld 117 (H) 70 - 99 mg/dL   BUN 16 8 - 23 mg/dL   Creatinine, Ser 0.88 0.61 - 1.24 mg/dL   Calcium 8.9 8.9 - 10.3 mg/dL   GFR calc non Af Amer >60 >60 mL/min   GFR calc Af Amer >60 >60 mL/min   Anion gap 9 5 - 15    Comment: Performed at [facility] [address] [place], [place] [address]  CBC     Status: Abnormal   Collection Time: 02/05/19  3:45 AM  Result Value Ref Range   WBC 11.2 (H) 4.0 - 10.5 K/uL   RBC 4.67 4.22 - 5.81 MIL/uL   Hemoglobin 14.9 13.0 - 17.0 g/dL   HCT 43.9 39.0 - 52.0 %   MCV 94.0 80.0 - 100.0 fL   MCH 31.9 26.0 - 34.0 pg   MCHC 33.9 30.0 - 36.0 g/dL   RDW 12.5 11.5 - 15.5 %   Platelets 258 150 - 400 K/uL   nRBC 0.0 0.0 - 0.2 %    Comment: Performed at [facility], [place] [address], [place], [place] [address]    Radiology/Results: CT Angio Abdomen W and/or Wo Contrast  Result Date: 02/04/2019 CLINICAL DATA:  Mesenteric ischemia. Upper abdominal pain. EXAM: CT ANGIOGRAPHY ABDOMEN TECHNIQUE: Multidetector CT imaging of the abdomen was performed using the standard protocol during bolus administration of intravenous contrast. Multiplanar reconstructed images and MIPs were obtained and reviewed to evaluate the vascular anatomy. CONTRAST:  169mL OMNIPAQUE IOHEXOL 350 MG/ML SOLN COMPARISON:  None. FINDINGS: VASCULAR Aorta: Mild atherosclerosis. Normal in caliber. No  dissection, vasculitis, or significant stenosis. Celiac: Mild (less than 50%) stenosis at the origin due to the diaphragmatic crus. No significant poststenotic dilatation. Hepatic and splenic arteries are widely patent. No dissection or vasculitis. SMA: Widely patent without stenosis, dissection, or vasculitis. Mesenteric branch vessels are widely patent. No evidence of embolic disease. Renals: Single bilateral renal arteries are widely patent. Minimal plaque at the origin on the right. No significant stenosis, vasculitis, or dissection. IMA: Widely patent. Inflow: Widely patent with mild atherosclerosis. No dissection, significant stenosis or evidence of vasculitis. Veins: Venous phase imaging demonstrates patency of the portal and splenic veins. No evidence of mesenteric vein thrombosis. No evidence of acute venous abnormality. Review of the MIP images confirms the above findings. NON-VASCULAR Lower chest: Mild dependent atelectasis. No pleural fluid. Heart size is normal. Small hiatal hernia. Hepatobiliary: Arterial enhancement along the falciform ligament is likely an arterial portal shunt. There is no evidence of focal lesion. Multiple gallstones within physiologically distended gallbladder. No biliary dilatation. Pancreas: No ductal dilatation or inflammation. Spleen: Normal in size without focal abnormality. Adrenals/Urinary Tract: Normal adrenal glands. Parapelvic cysts in the left kidney versus mild left hydronephrosis. Parapelvic cysts are favored, as there is no ureteral dilatation. No perinephric edema of either kidney. Tiny hypodensity in the lower right kidney is too small to characterize but likely small cyst. No evidence of renal ureteral calculi. Urinary bladder is physiologically distended. No bladder wall thickening. Stomach/Bowel: Small hiatal hernia. Stomach is partially distended. Small duodenal diverticulum without inflammation. The proximal small bowel is nondilated. There is transition from  nondilated to dilated small bowel in the left upper quadrant,  series 11, image 38. Dilated fluid-filled small bowel with distal transition point in the right lower quadrant, series 11, image 50. There is no pneumatosis. More distal small bowel is decompressed. Mild mesenteric edema and free fluid. High-riding cecum in the right upper quadrant. Normal appendix courses into the right upper quadrant. Colonic diverticulosis from the descending through the sigmoid colon. No evidence of diverticulitis. Lymphatic: No enlarged lymph nodes in the abdomen or pelvis. Reproductive: Prostate gland is normal in size. Other: Small volume abdominopelvic ascites. Mild mesenteric edema and mesenteric free fluid. No free air or intra-abdominal abscess. Fat in both inguinal canals. Musculoskeletal: There are no acute or suspicious osseous abnormalities. Degenerative change in the lower lumbar spine. IMPRESSION: VASCULAR 1. No acute mesenteric vascular abnormality. Slight stenosis at the origin of the celiac artery due to the diaphragmatic crus, mesenteric vessels are otherwise widely patent. 2. Mild aortic atherosclerosis. NON-VASCULAR 1. Small bowel obstruction with contiguous segment of obstructed bowel with separate transition points, proximal transition point in left upper quadrant and distal transition point in the right lower quadrant. There is no pneumatosis or free air to suggest perforation, however there is moderate mesenteric edema in the right lower quadrant. Small volume abdominopelvic ascites. 2. Probable parapelvic cysts in the left kidney, less likely hydronephrosis. There is no ureteral dilatation or perinephric edema to suggest urinary obstruction. 3. Colonic diverticulosis without diverticulitis. 4. Incidental cholelithiasis and small hiatal hernia. Aortic Atherosclerosis (ICD10-I70.0). Electronically Signed   By: Keith Rake M.D.   On: 02/04/2019 19:55   DG Abd Portable 1V-Small Bowel Obstruction  Protocol-initial, 8 hr delay  Result Date: 02/05/2019 CLINICAL DATA:  Small bowel obstruction. 8 hour film after contrast. EXAM: PORTABLE ABDOMEN - 1 VIEW COMPARISON:  CT scan February 04, 2019.  KUB February 05, 2019. FINDINGS: Contrast is seen throughout the entire length of the colon. Small bowel loops in the left upper quadrant remain dilated up to 4.2 cm. No free air, portal venous gas, or pneumatosis. No other acute abnormalities are identified. IMPRESSION: Continued small bowel obstruction with dilatation of small bowel up to 4.2 cm. The contrast extends throughout the colon suggesting the small-bowel obstruction is partial. Electronically Signed   By: Dorise Bullion III M.D   On: 02/05/2019 18:28   DG Abd Portable 2V  Result Date: 02/05/2019 CLINICAL DATA:  Epigastric pain.  Evaluate bowel obstruction. EXAM: PORTABLE ABDOMEN - 2 VIEW COMPARISON:  CT abdomen 02/04/2019 FINDINGS: Multiple gaseous distended loops of small bowel are demonstrated throughout the abdomen measuring up to approximately 4 cm. There are differential air-fluid levels demonstrated on upright images. No definite free intraperitoneal air. Lung bases are clear. Calcified stones within the gallbladder. Lumbar spine degenerative changes. IMPRESSION: Findings compatible with small-bowel obstruction. Electronically Signed   By: Lovey Newcomer M.D.   On: 02/05/2019 07:00    Anti-infectives: Anti-infectives (From admission, onward)   None      Assessment/Plan: Problem List: Patient Active Problem List   Diagnosis Date Noted  . SBO (small bowel obstruction) ([place]) 02/04/2019  . Chronic throat clearing 04/14/2016  . Chronic rhinitis 04/14/2016  . Mild acid reflux 04/14/2016  . Inguinal hernia, left 06/22/2013  . Testosterone deficiency 04/29/2013  . Erectile dysfunction 04/29/2013    Will repeat labs today including lipase.  Start clears after lab and recheck xray in the morning.   * No surgery found *    LOS: 2 days    Matt B. Hassell Done, MD, [facility] [phone] beeper  [id number]  02/06/2019 8:24 AM

## 2019-02-06 NOTE — Plan of Care (Signed)

## 2019-02-07 ENCOUNTER — Inpatient Hospital Stay (HOSPITAL_COMMUNITY): Payer: Medicare Other

## 2019-02-07 LAB — BASIC METABOLIC PANEL
Anion gap: 7 (ref 5–15)
BUN: 10 mg/dL (ref 8–23)
CO2: 28 mmol/L (ref 22–32)
Calcium: 8.8 mg/dL — ABNORMAL LOW (ref 8.9–10.3)
Chloride: 104 mmol/L (ref 98–111)
Creatinine, Ser: 0.9 mg/dL (ref 0.61–1.24)
GFR calc Af Amer: >60 mL/min (ref >60)
GFR calc non Af Amer: >60 mL/min (ref >60)
Glucose, Bld: 121 mg/dL — ABNORMAL HIGH (ref 70–99)
Potassium: 3.9 mmol/L (ref 3.5–5.1)
Sodium: 139 mmol/L (ref 135–145)

## 2019-02-07 MED ORDER — POLYETHYLENE GLYCOL 3350 17 G PO PACK: 17.0000 g | PACK | Freq: Every day | ORAL | Status: AC | PRN

## 2019-02-07 MED ORDER — DOCUSATE SODIUM 100 MG PO CAPS: 100.0000 mg | ORAL_CAPSULE | Freq: Every day | ORAL | Status: AC | PRN

## 2019-02-07 MED ORDER — SODIUM CHLORIDE 0.9 % IV BOLUS
500.0000 mL | Freq: Once | INTRAVENOUS | Status: AC
  Administered 2019-02-07: 500 mL via INTRAVENOUS

## 2019-02-07 NOTE — Progress Notes (Signed)
[facility] Progress Note     Subjective: CC: sbo Patient reports no abdominal pain currently but did have some cramping in lower abdomen with BM earlier. Patient has had several loose stools. Denies nausea. Tolerating CLD. Patient hopeful to be able to go home later today if feeling well.   Review of Systems  Respiratory: Negative for shortness of breath.   Cardiovascular: Negative for chest pain.  Gastrointestinal: Positive for abdominal pain (cramping) and diarrhea. Negative for blood in stool, constipation, melena, nausea and vomiting.  Genitourinary: Negative for dysuria, frequency and urgency.  All other systems reviewed and are negative.   Objective: Vital signs in last 24 hours: Temp:  [97.4 F (36.3 C)-98.1 F (36.7 C)] 98 F (36.7 C) (02/01 0537) Pulse Rate:  [53-64] 53 (02/01 0537) Resp:  [17-18] 17 (02/01 0537) BP: (115-119)/(66-78) 119/78 (02/01 0537) SpO2:  [96 %-100 %] 96 % (02/01 0537) Last BM Date: 02/06/19  Intake/Output from previous day: 01/31 0701 - 02/01 0700 In: 1816 [P.O.:1540; I.V.:276] Out: -  Intake/Output this shift: No intake/output data recorded.  PE: General: pleasant, WD, WN white male who is laying in bed in NAD HEENT: Sclera are noninjected.  PERRL.  Ears and nose without any masses or lesions.  Mouth is pink and moist Heart: regular, rate, and rhythm.  Normal s1,s2. No obvious murmurs, gallops, or rubs noted.  Palpable radial and pedal pulses bilaterally Lungs: CTAB, no wheezes, rhonchi, or rales noted.  Respiratory effort nonlabored Abd: soft, NT, ND, +BS, no masses, hernias, or organomegaly MS: all 4 extremities are symmetrical with no cyanosis, clubbing, or edema. Skin: warm and dry with no masses, lesions, or rashes Neuro: Cranial nerves 2-12 grossly intact, speech is normal Psych: A&Ox3 with an appropriate affect.   Lab Results:  Recent Labs    02/05/19 0345 02/06/19 0833  WBC 11.2* 7.0  HGB 14.9 13.8  HCT 43.9  42.4  PLT 258 233   BMET Recent Labs    02/05/19 0345 02/06/19 0833  NA 137 141  K 4.0 4.3  CL 103 106  CO2 25 26  GLUCOSE 117* 92  BUN 16 17  CREATININE 0.88 1.01  CALCIUM 8.9 8.4*   PT/INR No results for input(s): LABPROT, INR in the last 72 hours. CMP     Component Value Date/Time   NA 141 02/06/2019 0833   K 4.3 02/06/2019 0833   CL 106 02/06/2019 0833   CO2 26 02/06/2019 0833   GLUCOSE 92 02/06/2019 0833   BUN 17 02/06/2019 0833   CREATININE 1.01 02/06/2019 0833   CALCIUM 8.4 (L) 02/06/2019 0833   PROT 5.8 (L) 02/06/2019 0833   ALBUMIN 3.1 (L) 02/06/2019 0833   AST 22 02/06/2019 0833   ALT 17 02/06/2019 0833   ALKPHOS 43 02/06/2019 0833   BILITOT 1.0 02/06/2019 0833   GFRNONAA >60 02/06/2019 0833   GFRAA >60 02/06/2019 0833   Lipase     Component Value Date/Time   LIPASE 25 02/06/2019 0833       Studies/Results: DG Abd Portable 1V-Small Bowel Obstruction Protocol-initial, 8 hr delay  Result Date: 02/05/2019 CLINICAL DATA:  Small bowel obstruction. 8 hour film after contrast. EXAM: PORTABLE ABDOMEN - 1 VIEW COMPARISON:  CT scan February 04, 2019.  KUB February 05, 2019. FINDINGS: Contrast is seen throughout the entire length of the colon. Small bowel loops in the left upper quadrant remain dilated up to 4.2 cm. No free air, portal venous gas, or pneumatosis. No other acute  abnormalities are identified. IMPRESSION: Continued small bowel obstruction with dilatation of small bowel up to 4.2 cm. The contrast extends throughout the colon suggesting the small-bowel obstruction is partial. Electronically Signed   By: Dorise Bullion III M.D   On: 02/05/2019 18:28    Anti-infectives: Anti-infectives (From admission, onward)   None       Assessment/Plan AKI - Cr 1 from 0.8, give fluid bolus and repeat BMET at 1200  SBO - patient is tolerating CLD and having bowel function - very mild tenderness on deep palpation in RLQ, abdominal exam otherwise benign -  will repeat films today - if tolerating soft diet and films look ok, likely discharge this afternoon   FEN: soft diet  VTE: SCDs, lovenox ID: no abx indicated  LOS: 3 days    Brigid Re , [facility] 02/07/2019, 7:49 AM Please see Amion for pager number during day hours 7:00am-4:30pm

## 2019-02-07 NOTE — Progress Notes (Signed)
Pt discharged home in stable condition 

## 2019-02-07 NOTE — Discharge Summary (Signed)
Donald Montgomery   Patient ID: Donald Montgomery MRN: [id number] DOB/AGE: 07-Nov-1945 74 y.o.  Admit date: 02/04/2019 Discharge date: 02/07/2019  Admitting Diagnosis: SBO  Discharge Diagnosis SBO  Consultants None   Imaging: DG Abd 2 Views  Result Date: 02/07/2019 CLINICAL DATA:  Small bowel obstruction follow-up. EXAM: ABDOMEN - 2 VIEW COMPARISON:  Abdominal x-ray from yesterday. FINDINGS: Unchanged small bowel dilatation in the left upper abdomen measuring up to 4.2 cm. There is some residual contrast within the left colon. No pneumoperitoneum. Unchanged cholelithiasis. No acute osseous abnormality. IMPRESSION: Unchanged partial small bowel obstruction. Electronically Signed   By: Titus Dubin M.D.   On: 02/07/2019 08:28   DG Abd Portable 1V-Small Bowel Obstruction Protocol-initial, 8 hr delay  Result Date: 02/05/2019 CLINICAL DATA:  Small bowel obstruction. 8 hour film after contrast. EXAM: PORTABLE ABDOMEN - 1 VIEW COMPARISON:  CT scan February 04, 2019.  KUB February 05, 2019. FINDINGS: Contrast is seen throughout the entire length of the colon. Small bowel loops in the left upper quadrant remain dilated up to 4.2 cm. No free air, portal venous gas, or pneumatosis. No other acute abnormalities are identified. IMPRESSION: Continued small bowel obstruction with dilatation of small bowel up to 4.2 cm. The contrast extends throughout the colon suggesting the small-bowel obstruction is partial. Electronically Signed   By: Dorise Bullion III M.D   On: 02/05/2019 18:28    Procedures None  [facility] Course:  Patient is a 74 year old male who presented to [facility] with abdominal pain.  Workup showed SBO.  Patient was admitted and started on small bowel protocol with PO gastrografin.  Tolerated this well and had some return of bowel function.  Diet was advanced as tolerated.  On 02/07/19, the patient was voiding well, tolerating diet, ambulating well, pain well controlled,  vital signs stable and felt stable for discharge home.  Patient will follow up with PCP in 1-2 weeks and knows to call [facility] as needed with questions or concerns.  Physical Exam: See progress note from earlier   Allergies as of 02/07/2019   No Known Allergies     Medication List    TAKE these medications   Align 4 MG Caps Take 4 mg by mouth daily.   AndroGel Pump 20.25 MG/ACT (1.62%) Gel Generic drug: Testosterone Apply 4 application topically See admin instructions. Apply 2 pumps to each shoulder once a day   b complex vitamins tablet Take 1 tablet by mouth daily with supper.   CO Q 10 PO Take 1 capsule by mouth daily.   docusate sodium 100 MG capsule Commonly known as: Colace Take 1 capsule (100 mg total) by mouth daily as needed for mild constipation.   Fish Oil 1000 MG Caps Take 1,000 mg by mouth daily with supper.   ibuprofen 200 MG tablet Commonly known as: ADVIL Take 200 mg by mouth every 6 (six) hours as needed for headache or mild pain.   L-ARGININE PO Take 1 tablet by mouth daily with lunch.   MAGNESIUM PO Take 1 tablet by mouth daily with supper.   polyethylene glycol 17 g packet Commonly known as: MiraLax Take 17 g by mouth daily as needed for moderate constipation.   SAMBUCUS ELDERBERRY PO Take 1 tablet by mouth daily.   tadalafil 5 MG tablet Commonly known as: CIALIS Take 10-15 mg by mouth daily as needed for erectile dysfunction.   Vitamin D-3 25 MCG (1000 UT) Caps Take 2,000 Units by mouth daily  with supper.        Follow-up Information    Billie Ruddy, MD. Schedule an appointment as soon as possible for a visit.   Specialty: Family Medicine Why: Schedule an appointment in 1-2 weeks for post-hospitalization check up.  Contact information: [place] [address] [phone]        Surgery, [place]. Call.   Specialty: General Surgery Why: Call as needed with questions or concerns.  Contact  information: [address] [place] [address] [phone]           Signed: Brigid Re, [facility] 02/07/2019, 1:25 PM Please see Amion for pager number during day hours 7:00am-4:30pm

## 2019-02-07 NOTE — Discharge Instructions (Signed)
Bowel Obstruction A bowel obstruction means that something is blocking the small or large bowel. The bowel is also called the intestine. It is the long tube that connects the stomach to the opening of the butt (anus). When something blocks the bowel, food and fluids cannot pass through like normal. This condition needs to be treated. Treatment depends on the cause of the problem and how bad the problem is. What are the causes? Common causes of this condition include:  Scar tissue (adhesions) from past surgery or from high-energy X-rays (radiation).  Recent surgery in the belly. This affects how food moves in the bowel.  Some diseases, such as: ? Irritation of the lining of the digestive tract (Crohn's disease). ? Irritation of small pouches in the bowel (diverticulitis).  Growths or tumors.  A bulging organ (hernia).  Twisting of the bowel (volvulus).  A foreign body.  Slipping of a part of the bowel into another part (intussusception). What are the signs or symptoms? Symptoms of this condition include:  Pain in the belly.  Feeling sick to your stomach (nauseous).  Throwing up (vomiting).  Bloating in the belly.  Being unable to pass gas.  Trouble pooping (constipation).  Watery poop (diarrhea).  A lot of belching. How is this diagnosed? This condition may be diagnosed based on:  A physical exam.  Medical history.  Imaging tests, such as X-ray or CT scan.  Blood tests.  Urine tests. How is this treated? Treatment for this condition may include:  Fluids and pain medicines that are given through an IV tube. Your doctor may tell you not to eat or drink if you feel sick to your stomach and are throwing up.  Eating a clear liquid diet for a few days.  Putting a small tube (nasogastric tube) into the stomach. This will help with pain, discomfort, and nausea by removing blocked air and fluids from the stomach.  Surgery. This may be needed if other treatments do  not work. Follow these instructions at home: Medicines  Take over-the-counter and prescription medicines only as told by your doctor.  If you were prescribed an antibiotic medicine, take it as told by your doctor. Do not stop taking the antibiotic even if you start to feel better. General instructions  Follow your diet as told by your doctor. You may need to: ? Only drink clear liquids until you start to get better. ? Avoid solid foods.  Return to your normal activities as told by your doctor. Ask your doctor what activities are safe for you.  Do not sit for a long time without moving. Get up to take short walks every 1-2 hours. This is important. Ask for help if you feel weak or unsteady.  Keep all follow-up visits as told by your doctor. This is important. How is this prevented? After having a bowel obstruction, you may be more likely to have another. You can do some things to stop it from happening again.  If you have a long-term (chronic) disease, contact your doctor if you see changes or problems.  Take steps to prevent or treat trouble pooping. Your doctor may ask that you: ? Drink enough fluid to keep your pee (urine) pale yellow. ? Take over-the-counter or prescription medicines. ? Eat foods that are high in fiber. These include beans, whole grains, and fresh fruits and vegetables. ? Limit foods that are high in fat and sugar. These include fried or sweet foods.  Stay active. Ask your doctor which exercises are  safe for you.  Avoid stress.  Eat three small meals and three small snacks each day.  Work with a Publishing rights manager (dietitian) to make a meal plan that works for you.  Do not use any products that contain nicotine or tobacco, such as cigarettes and e-cigarettes. If you need help quitting, ask your doctor. Contact a doctor if:  You have a fever.  You have chills. Get help right away if:  You have pain or cramps that get worse.  You throw up blood.  You are  sick to your stomach.  You cannot stop throwing up.  You cannot drink fluids.  You feel mixed up (confused).  You feel very thirsty (dehydrated).  Your belly gets more bloated.  You feel weak or you pass out (faint). Summary  A bowel obstruction means that something is blocking the small or large bowel.  Treatment may include IV fluids and pain medicine. You may also have a clear liquid diet, a small tube in your stomach, or surgery.  Drink clear liquids and avoid solid foods until you get better. This information is not intended to replace advice given to you by your health care provider. Make sure you discuss any questions you have with your health care provider. Document Revised: 05/06/2017 Document Reviewed: 05/06/2017 Elsevier Patient Education  [place] A soft-food eating plan includes foods that are safe and easy to chew and swallow. Your health care provider or dietitian can help you find foods and flavors that fit into this plan. Follow this plan until your health care provider or dietitian says it is safe to start eating other foods and food textures. What are tips for following this plan? General guidelines   Take small bites of food, or cut food into pieces about  inch or smaller. Bite-sized pieces of food are easier to chew and swallow.  Eat moist foods. Avoid overly dry foods.  Avoid foods that: ? Are difficult to swallow, such as dry, chunky, crispy, or sticky foods. ? Are difficult to chew, such as hard, tough, or stringy foods. ? Contain nuts, seeds, or fruits.  Follow instructions from your dietitian about the types of liquids that are safe for you to swallow. You may be allowed to have: ? Thick liquids only. This includes only liquids that are thicker than honey. ? Thin and thick liquids. This includes all beverages and foods that become liquid at room temperature.  To make thick liquids: ? Purchase a commercial  liquid thickening powder. These are available at grocery stores and pharmacies. ? Mix the thickener into liquids according to instructions on the label. ? Purchase ready-made thickened liquids. ? Thicken soup by pureeing, straining to remove chunks, and adding flour, potato flakes, or corn starch. ? Add commercial thickener to foods that become liquid at room temperature, such as milk shakes, yogurt, ice cream, gelatin, and sherbet.  Ask your health care provider whether you need to take a fiber supplement. Cooking  Cook meats so they stay tender and moist. Use methods like braising, stewing, or baking in liquid.  Cook vegetables and fruit until they are soft enough to be mashed with a fork.  Peel soft, fresh fruits such as peaches, nectarines, and melons.  When making soup, make sure chunks of meat and vegetables are smaller than  inch.  Reheat leftover foods slowly so that a tough crust does not form. What foods are allowed? The items listed below may not be a  complete list. Talk with your dietitian about what dietary choices are best for you. Grains Breads, muffins, pancakes, or waffles moistened with syrup, jelly, or butter. Dry cereals well-moistened with milk. Moist, cooked cereals. Well-cooked pasta and rice. Vegetables All soft-cooked vegetables. Shredded lettuce. Fruits All canned and cooked fruits. Soft, peeled fresh fruits. Strawberries. Dairy Milk. Cream. Yogurt. Cottage cheese. Soft cheese without the rind. Meats and other protein foods Tender, moist ground meat, poultry, or fish. Meat cooked in gravy or sauces. Eggs. Sweets and desserts Ice cream. Milk shakes. Sherbet. Pudding. Fats and oils Butter. Margarine. Olive, canola, sunflower, and grapeseed oil. Smooth salad dressing. Smooth cream cheese. Mayonnaise. Gravy. What foods are not allowed? The items listed bemay not be a complete list. Talk with your dietitian about what dietary choices are best for  you. Grains Coarse or dry cereals, such as bran, granola, and shredded wheat. Tough or chewy crusty breads, such as [place] bread or baguettes. Breads with nuts, seeds, or fruit. Vegetables All raw vegetables. Cooked corn. Cooked vegetables that are tough or stringy. Tough, crisp, fried potatoes and potato skins. Fruits Fresh fruits with skins or seeds, or both, such as apples, pears, and grapes. Stringy, high-pulp fruits, such as papaya, pineapple, coconut, and mango. Fruit leather and all dried fruit. Dairy Yogurt with nuts or coconut. Meats and other protein foods Hard, dry sausages. Dry meat, poultry, or fish. Meats with gristle. Fish with bones. Fried meat or fish. Lunch meat and hotdogs. Nuts and seeds. Chunky peanut butter or other nut butters. Sweets and desserts Cakes or cookies that are very dry or chewy. Desserts with dried fruit, nuts, or coconut. Fried pastries. Very rich pastries. Fats and oils Cream cheese with fruit or nuts. Salad dressings with seeds or chunks. Summary  A soft-food eating plan includes foods that are safe and easy to swallow. Generally, the foods should be soft enough to be mashed with a fork.  Avoid foods that are dry, hard to chew, crunchy, sticky, stringy, or crispy.  Ask your health care provider whether you need to thicken your liquids and if you need to take a fiber supplement. This information is not intended to replace advice given to you by your health care provider. Make sure you discuss any questions you have with your health care provider. Document Revised: 04/15/2018 Document Reviewed: 02/26/2016 Elsevier Patient Education  [place].

## 2019-02-08 ENCOUNTER — Telehealth: Payer: Self-pay

## 2019-02-08 ENCOUNTER — Ambulatory Visit: Payer: Medicare Other

## 2019-02-08 ENCOUNTER — Encounter: Payer: Self-pay | Admitting: Family Medicine

## 2019-02-08 NOTE — Telephone Encounter (Signed)
Transition Care Management Follow-up Telephone Call  Date of discharge and from where: 02/07/19 from [facility]  How have you been since you were released from the hospital? "I'm feeling much better"  Any questions or concerns? No   Items Reviewed:  Did the pt receive and understand the discharge instructions provided? Yes   Medications obtained and verified? Yes   Any new allergies since your discharge? No   Dietary orders reviewed? yes  Do you have support at home? Yes   Other (ie: DME, Home Health, etc) no  Functional Questionnaire: (I = Independent and D = Dependent) ADL's: "I am able to do that for myself"  Bathing/Dressing-    Meal Prep-   Eating-   Maintaining continence-   Transferring/Ambulation-   Managing Meds-    Follow up appointments reviewed:    PCP Hospital f/u appt confirmed? Yes  Scheduled to see Dr. Volanda Napoleon on 02/16/19 1:30 pm telephone visit  [facility] f[facility]u [facility]t confirmed? No  Are transportation arrangements needed? No   If their condition worsens, is the pt aware to call  their PCP or go to the ED? Yes  Was the patient provided with contact information for the PCP's office or ED? Yes  Was the pt encouraged to call back with questions or concerns? Yes

## 2019-02-11 ENCOUNTER — Ambulatory Visit: Payer: Medicare Other

## 2019-02-14 NOTE — Telephone Encounter (Signed)
This message was solved and nothing further needed

## 2019-02-16 ENCOUNTER — Telehealth (INDEPENDENT_AMBULATORY_CARE_PROVIDER_SITE_OTHER): Payer: Medicare Other | Admitting: Family Medicine

## 2019-02-16 DIAGNOSIS — Z8719 Personal history of other diseases of the digestive system: Secondary | ICD-10-CM | POA: Diagnosis not present

## 2019-02-16 DIAGNOSIS — R1010 Upper abdominal pain, unspecified: Secondary | ICD-10-CM

## 2019-02-16 NOTE — Progress Notes (Signed)
Virtual Visit via Video Note  I connected with Donald Montgomery on 02/16/19 at  1:30 PM EST by a video enabled telemedicine application 2/2 [id number] pandemic and verified that I am speaking with the correct person using two identifiers.  Location patient: home Location provider:work or home office Persons participating in the virtual visit: patient, provider  I discussed the limitations of evaluation and management by telemedicine and the availability of in person appointments. The patient expressed understanding and agreed to proceed.   HPI: Pt admitted for SBO 1/29-02/07/2019.  Pt made NPO, given gastrografin. Diet advanced as tolerated.  Started having diarrhea.  Since being home, pt had a large meal which caused abdominal pain.  He called the surgeon's office and was advised to start BRAT diet.  Notes improvement in symptoms, having BMs.  Denies n/v, constipation, diarrhea, fever.  Pt inquires if the COVID vaccine could have caused the SBO. Pt drinking coca-cola as read on the Internet it can prevent SBO.    Prior surgery included a hernia repair.  ROS: See pertinent positives and negatives per HPI.  Past Medical History:  Diagnosis Date  . Cancer [facility])     Past Surgical History:  Procedure Laterality Date  . TONSILLECTOMY      Family History  Problem Relation Age of Onset  . Heart disease Mother   . Stroke Brother   . Heart disease Brother   . Allergic rhinitis Neg Hx   . Angioedema Neg Hx   . Asthma Neg Hx   . Eczema Neg Hx   . Immunodeficiency Neg Hx   . Urticaria Neg Hx     Current Outpatient Medications:  .  ANDROGEL PUMP 20.25 MG/ACT (1.62%) GEL, Apply 4 application topically See admin instructions. Apply 2 pumps to each shoulder once a day, Disp: , Rfl:  .  b complex vitamins tablet, Take 1 tablet by mouth daily with supper., Disp: , Rfl:  .  Black Elderberry (SAMBUCUS ELDERBERRY PO), Take 1 tablet by mouth daily., Disp: , Rfl:  .  Cholecalciferol (VITAMIN D-3) 25  MCG (1000 UT) CAPS, Take 2,000 Units by mouth daily with supper., Disp: , Rfl:  .  Coenzyme Q10 (CO Q 10 PO), Take 1 capsule by mouth daily., Disp: , Rfl:  .  docusate sodium (COLACE) 100 MG capsule, Take 1 capsule (100 mg total) by mouth daily as needed for mild constipation., Disp: , Rfl:  .  ibuprofen (ADVIL) 200 MG tablet, Take 200 mg by mouth every 6 (six) hours as needed for headache or mild pain., Disp: , Rfl:  .  L-ARGININE PO, Take 1 tablet by mouth daily with lunch., Disp: , Rfl:  .  MAGNESIUM PO, Take 1 tablet by mouth daily with supper., Disp: , Rfl:  .  Omega-3 Fatty Acids (FISH OIL) 1000 MG CAPS, Take 1,000 mg by mouth daily with supper. , Disp: , Rfl:  .  polyethylene glycol (MIRALAX) 17 g packet, Take 17 g by mouth daily as needed for moderate constipation., Disp: , Rfl:  .  Probiotic Product (ALIGN) 4 MG CAPS, Take 4 mg by mouth daily., Disp: , Rfl:  .  tadalafil (CIALIS) 5 MG tablet, Take 10-15 mg by mouth daily as needed for erectile dysfunction. , Disp: , Rfl:   EXAM:  VITALS per patient if applicable:  RR between 12-20 bpm  GENERAL: alert, oriented, appears well and in no acute distress  HEENT: atraumatic, conjunctiva clear, no obvious abnormalities on inspection of external nose and ears  NECK: normal movements of the head and neck  LUNGS: on inspection no signs of respiratory distress, breathing rate appears normal, no obvious gross SOB, gasping or wheezing  CV: no obvious cyanosis  MS: moves all visible extremities without noticeable abnormality  PSYCH/NEURO: pleasant and cooperative, no obvious depression or anxiety, speech and thought processing grossly intact  ASSESSMENT AND PLAN:  Discussed the following assessment and plan:  History of small bowel obstruction  Pain of upper abdomen -improving -advance diet as tolerated. -given precautions for worsened or continued symptoms. -has info for CCS if needed.  F/u prn  I discussed the assessment and  treatment plan with the patient. The patient was provided an opportunity to ask questions and all were answered. The patient agreed with the plan and demonstrated an understanding of the instructions.   The patient was advised to call back or seek an in-person evaluation if the symptoms worsen or if the condition fails to improve as anticipated.   Billie Ruddy, MD

## 2019-02-16 NOTE — Patient Instructions (Signed)
Bowel Obstruction A bowel obstruction is a blockage in the small or large bowel. The bowel, which is also called the intestine, is a long, slender tube that connects the stomach to the anus. When a person eats and drinks, food and fluids go from the mouth to the stomach to the small bowel. This is where most of the nutrients in the food and fluids are absorbed. After the small bowel, material passes through the large bowel for further absorption until any leftover material leaves the body as stool through the anus during a bowel movement. A bowel obstruction will prevent food and fluids from passing through the bowel as they normally do during digestion. The bowel can become partially or completely blocked. If this condition is not treated, it can be dangerous because the bowel could rupture. What are the causes? Common causes of this condition include:  Scar tissue (adhesions) from previous surgery or treatment with high-energy X-rays (radiation).  Recent surgery. This may cause the movements of the bowel to slow down and cause food to block the intestine.  Inflammatory bowel disease, such as Crohn's disease or diverticulitis.  Growths or tumors.  A bulging organ (hernia).  Twisting of the bowel (volvulus).  A foreign body.  Slipping of a part of the bowel into another part (intussusception). What are the signs or symptoms? Symptoms of this condition include:  Pain in the abdomen. Depending on the degree of obstruction, pain may be: ? Mild or severe. ? Dull cramping or sharp pain. ? In one area or in the entire abdomen.  Nausea and vomiting. Vomit may be greenish or a yellow bile color.  Bloating in the abdomen.  Difficulty passing stool (constipation).  Lack of passing gas.  Frequent belching.  Diarrhea. This may occur if the obstruction is partial and runny stool is able to leak around the obstruction. How is this diagnosed? This condition may be diagnosed based on:  A  physical exam.  Medical history.  Imaging tests of the abdomen or pelvis, such as X-ray or CT scan.  Blood or urine tests. How is this treated? Treatment for this condition depends on the cause and severity of the problem. Treatment may include:  Fluids and pain medicines that are given through an IV. Your health care provider may instruct you not to eat or drink if you have nausea or vomiting.  Eating a simple diet. You may be asked to consume a clear liquid diet for several days. This allows the bowel to rest.  Placement of a small tube (nasogastric tube) into the stomach. This will relieve pain, discomfort, and nausea by removing blocked air and fluids from the stomach. It can also help the obstruction clear up faster.  Surgery. This may be required if other treatments do not work. Surgery may be required for: ? Bowel obstruction from a hernia. This can be an emergency procedure. ? Scar tissue that causes frequent or severe obstructions. Follow these instructions at home: Medicines  Take over-the-counter and prescription medicines only as told by your health care provider.  If you were prescribed an antibiotic medicine, take it as told by your health care provider. Do not stop taking the antibiotic even if you start to feel better. General instructions  Follow instructions from your health care provider about eating restrictions. You may need to avoid solid foods and consume only clear liquids until your condition improves.  Return to your normal activities as told by your health care provider. Ask your health care   provider what activities are safe for you.  Avoid sitting for a long time without moving. Get up to take short walks every 1-2 hours. This is important to improve blood flow and breathing. Ask for help if you feel weak or unsteady.  Keep all follow-up visits as told by your health care provider. This is important. How is this prevented? After having a bowel  obstruction, you are more likely to have another. You may do the following things to prevent another obstruction:  If you have a long-term (chronic) disease, pay attention to your symptoms and contact your health care provider if you have questions or concerns.  Avoid becoming constipated. To prevent or treat constipation, your health care provider may recommend that you: ? Drink enough fluid to keep your urine pale yellow. ? Take over-the-counter or prescription medicines. ? Eat foods that are high in fiber, such as beans, whole grains, and fresh fruits and vegetables. ? Limit foods that are high in fat and processed sugars, such as fried or sweet foods.  Stay active. Exercise for 30 minutes or more, 5 or more days each week. Ask your health care provider which exercises are safe for you.  Avoid stress. Find ways to reduce stress, such as meditation, exercise, or taking time for activities that relax you.  Instead of eating three large meals each day, eat three small meals with three small snacks.  Work with a dietitian to make a healthy meal plan that works for you.  Do not use any products that contain nicotine or tobacco, such as cigarettes and e-cigarettes. If you need help quitting, ask your health care provider. Contact a health care provider if you:  Have a fever.  Have chills. Get help right away if you:  Have increased pain or cramping.  Vomit blood.  Have uncontrolled vomiting or nausea.  Cannot drink fluids because of vomiting or pain.  Become confused.  Begin feeling very thirsty (dehydrated).  Have severe bloating.  Feel extremely weak or you faint. Summary  A bowel obstruction is a blockage in the small or large bowel.  A bowel obstruction will prevent food and fluids from passing through the bowel as they normally do during digestion.  Treatment for this condition depends on the cause and severity of the problem. It may include fluids and pain medicines  through an IV, a simple diet, a nasogastric tube, or surgery.  Follow instructions from your health care provider about eating restrictions. You may need to avoid solid foods and consume only clear liquids until your condition improves. This information is not intended to replace advice given to you by your health care provider. Make sure you discuss any questions you have with your health care provider. Document Revised: 01/29/2018 Document Reviewed: 05/06/2017 Elsevier Patient Education  2020 [employer]  

## 2019-02-28 ENCOUNTER — Telehealth: Payer: Medicare Other | Admitting: Family Medicine

## 2019-03-04 ENCOUNTER — Other Ambulatory Visit (HOSPITAL_COMMUNITY): Payer: Self-pay | Admitting: Surgery

## 2019-03-04 ENCOUNTER — Other Ambulatory Visit: Payer: Self-pay | Admitting: Surgery

## 2019-03-04 DIAGNOSIS — K56609 Unspecified intestinal obstruction, unspecified as to partial versus complete obstruction: Secondary | ICD-10-CM

## 2019-03-07 ENCOUNTER — Other Ambulatory Visit (HOSPITAL_COMMUNITY): Payer: Self-pay | Admitting: Surgery

## 2019-03-07 ENCOUNTER — Other Ambulatory Visit: Payer: Self-pay | Admitting: Surgery

## 2019-03-07 DIAGNOSIS — K56609 Unspecified intestinal obstruction, unspecified as to partial versus complete obstruction: Secondary | ICD-10-CM

## 2019-03-08 ENCOUNTER — Other Ambulatory Visit: Payer: Self-pay

## 2019-03-08 ENCOUNTER — Ambulatory Visit (HOSPITAL_COMMUNITY)
Admission: RE | Admit: 2019-03-08 | Discharge: 2019-03-08 | Disposition: A | Discharge status: Home / Self Care | Payer: Medicare Other | Source: Ambulatory Visit | Attending: Surgery | Admitting: Surgery

## 2019-03-08 DIAGNOSIS — R1110 Vomiting, unspecified: Secondary | ICD-10-CM | POA: Diagnosis not present

## 2019-03-08 DIAGNOSIS — R112 Nausea with vomiting, unspecified: Secondary | ICD-10-CM | POA: Diagnosis not present

## 2019-03-08 DIAGNOSIS — K56609 Unspecified intestinal obstruction, unspecified as to partial versus complete obstruction: Secondary | ICD-10-CM | POA: Diagnosis not present

## 2019-03-08 MED ORDER — IOHEXOL 300 MG/ML  SOLN
100.0000 mL | Freq: Once | INTRAMUSCULAR | Status: AC | PRN
  Administered 2019-03-08: 100 mL via INTRAVENOUS

## 2019-03-08 MED ORDER — BARIUM SULFATE 0.1 % PO SUSP
ORAL | Status: AC
  Filled 2019-03-08: qty 3

## 2019-03-25 ENCOUNTER — Other Ambulatory Visit: Payer: Self-pay | Admitting: Surgery

## 2019-03-25 DIAGNOSIS — K56609 Unspecified intestinal obstruction, unspecified as to partial versus complete obstruction: Secondary | ICD-10-CM | POA: Diagnosis not present

## 2019-08-04 ENCOUNTER — Telehealth: Payer: Self-pay | Admitting: Family Medicine

## 2019-08-04 NOTE — Telephone Encounter (Signed)
Patient was driving and would like a call back next week to schedule AWV

## 2019-08-12 DIAGNOSIS — H5022 Vertical strabismus, left eye: Secondary | ICD-10-CM | POA: Diagnosis not present

## 2019-08-12 DIAGNOSIS — H532 Diplopia: Secondary | ICD-10-CM | POA: Diagnosis not present

## 2019-08-12 DIAGNOSIS — H518 Other specified disorders of binocular movement: Secondary | ICD-10-CM | POA: Diagnosis not present

## 2019-08-12 DIAGNOSIS — H4912 Fourth [trochlear] nerve palsy, left eye: Secondary | ICD-10-CM | POA: Diagnosis not present

## 2019-08-24 LAB — PSA: PSA: 1.21

## 2019-08-26 ENCOUNTER — Telehealth: Payer: Self-pay | Admitting: Family Medicine

## 2019-08-26 NOTE — Telephone Encounter (Signed)
Left message for patient to schedule Annual Wellness Visit.  Please schedule with Nurse Health Advisor Shannon Crews, RN at [facility]  

## 2019-08-30 ENCOUNTER — Other Ambulatory Visit: Payer: Self-pay

## 2019-08-30 ENCOUNTER — Ambulatory Visit (INDEPENDENT_AMBULATORY_CARE_PROVIDER_SITE_OTHER): Payer: Medicare Other

## 2019-08-30 DIAGNOSIS — Z0000 Encounter for general adult medical examination without abnormal findings: Secondary | ICD-10-CM

## 2019-08-30 NOTE — Patient Instructions (Signed)
Donald Montgomery , Thank you for taking time to come for your Medicare Wellness Visit. I appreciate your ongoing commitment to your health goals. Please review the following plan we discussed and let me know if I can assist you in the future.   Screening recommendations/referrals: Colonoscopy: Currently due, please contact [facility] and  Get scheduled Recommended yearly ophthalmology/optometry visit for glaucoma screening and checkup Recommended yearly dental visit for hygiene and checkup  Vaccinations: Influenza vaccine: Up to date, next due this flu season 2021 Pneumococcal vaccine: Completed series Tdap vaccine: Up to date, next due 01/08/2022 Shingles vaccine: Currently due, please contact your pharmacy to discuss cost and to receive the vaccine    Advanced directives: Please bring copies of your advanced directives to your next office visit so that we may scan them into your chart.  Conditions/risks identified: None   Next appointment: None   Preventive Care 65 Years and Older, Male Preventive care refers to lifestyle choices and visits with your health care provider that can promote health and wellness. What does preventive care include?  A yearly physical exam. This is also called an annual well check.  Dental exams once or twice a year.  Routine eye exams. Ask your health care provider how often you should have your eyes checked.  Personal lifestyle choices, including:  Daily care of your teeth and gums.  Regular physical activity.  Eating a healthy diet.  Avoiding tobacco and drug use.  Limiting alcohol use.  Practicing safe sex.  Taking low doses of aspirin every day.  Taking vitamin and mineral supplements as recommended by your health care provider. What happens during an annual well check? The services and screenings done by your health care provider during your annual well check will depend on your age, overall health, lifestyle risk factors, and  family history of disease. Counseling  Your health care provider may ask you questions about your:  Alcohol use.  Tobacco use.  Drug use.  Emotional well-being.  Home and relationship well-being.  Sexual activity.  Eating habits.  History of falls.  Memory and ability to understand (cognition).  Work and work Statistician. Screening  You may have the following tests or measurements:  Height, weight, and BMI.  Blood pressure.  Lipid and cholesterol levels. These may be checked every 5 years, or more frequently if you are over 20 years old.  Skin check.  Lung cancer screening. You may have this screening every year starting at age 48 if you have a 30-pack-year history of smoking and currently smoke or have quit within the past 15 years.  Fecal occult blood test (FOBT) of the stool. You may have this test every year starting at age 91.  Flexible sigmoidoscopy or colonoscopy. You may have a sigmoidoscopy every 5 years or a colonoscopy every 10 years starting at age 64.  Prostate cancer screening. Recommendations will vary depending on your family history and other risks.  Hepatitis C blood test.  Hepatitis B blood test.  Sexually transmitted disease (STD) testing.  Diabetes screening. This is done by checking your blood sugar (glucose) after you have not eaten for a while (fasting). You may have this done every 1-3 years.  Abdominal aortic aneurysm (AAA) screening. You may need this if you are a current or former smoker.  Osteoporosis. You may be screened starting at age 90 if you are at high risk. Talk with your health care provider about your test results, treatment options, and if necessary, the need for  more tests. Vaccines  Your health care provider may recommend certain vaccines, such as:  Influenza vaccine. This is recommended every year.  Tetanus, diphtheria, and acellular pertussis (Tdap, Td) vaccine. You may need a Td booster every 10 years.  Zoster  vaccine. You may need this after age 62.  Pneumococcal 13-valent conjugate (PCV13) vaccine. One dose is recommended after age 79.  Pneumococcal polysaccharide (PPSV23) vaccine. One dose is recommended after age 70. Talk to your health care provider about which screenings and vaccines you need and how often you need them. This information is not intended to replace advice given to you by your health care provider. Make sure you discuss any questions you have with your health care provider. Document Released: 01/19/2015 Document Revised: 09/12/2015 Document Reviewed: 10/24/2014 Elsevier Interactive Patient Education  2017 [employer] Prevention in the Home Falls can cause injuries. They can happen to people of all ages. There are many things you can do to make your home safe and to help prevent falls. What can I do on the outside of my home?  Regularly fix the edges of walkways and driveways and fix any cracks.  Remove anything that might make you trip as you walk through a door, such as a raised step or threshold.  Trim any bushes or trees on the path to your home.  Use bright outdoor lighting.  Clear any walking paths of anything that might make someone trip, such as rocks or tools.  Regularly check to see if handrails are loose or broken. Make sure that both sides of any steps have handrails.  Any raised decks and porches should have guardrails on the edges.  Have any leaves, snow, or ice cleared regularly.  Use sand or salt on walking paths during winter.  Clean up any spills in your garage right away. This includes oil or grease spills. What can I do in the bathroom?  Use night lights.  Install grab bars by the toilet and in the tub and shower. Do not use towel bars as grab bars.  Use non-skid mats or decals in the tub or shower.  If you need to sit down in the shower, use a plastic, non-slip stool.  Keep the floor dry. Clean up any water that spills on the  floor as soon as it happens.  Remove soap buildup in the tub or shower regularly.  Attach bath mats securely with double-sided non-slip rug tape.  Do not have throw rugs and other things on the floor that can make you trip. What can I do in the bedroom?  Use night lights.  Make sure that you have a light by your bed that is easy to reach.  Do not use any sheets or blankets that are too big for your bed. They should not hang down onto the floor.  Have a firm chair that has side arms. You can use this for support while you get dressed.  Do not have throw rugs and other things on the floor that can make you trip. What can I do in the kitchen?  Clean up any spills right away.  Avoid walking on wet floors.  Keep items that you use a lot in easy-to-reach places.  If you need to reach something above you, use a strong step stool that has a grab bar.  Keep electrical cords out of the way.  Do not use floor polish or wax that makes floors slippery. If you must use wax, use non-skid  floor wax.  Do not have throw rugs and other things on the floor that can make you trip. What can I do with my stairs?  Do not leave any items on the stairs.  Make sure that there are handrails on both sides of the stairs and use them. Fix handrails that are broken or loose. Make sure that handrails are as long as the stairways.  Check any carpeting to make sure that it is firmly attached to the stairs. Fix any carpet that is loose or worn.  Avoid having throw rugs at the top or bottom of the stairs. If you do have throw rugs, attach them to the floor with carpet tape.  Make sure that you have a light switch at the top of the stairs and the bottom of the stairs. If you do not have them, ask someone to add them for you. What else can I do to help prevent falls?  Wear shoes that:  Do not have high heels.  Have rubber bottoms.  Are comfortable and fit you well.  Are closed at the toe. Do not wear  sandals.  If you use a stepladder:  Make sure that it is fully opened. Do not climb a closed stepladder.  Make sure that both sides of the stepladder are locked into place.  Ask someone to hold it for you, if possible.  Clearly mark and make sure that you can see:  Any grab bars or handrails.  First and last steps.  Where the edge of each step is.  Use tools that help you move around (mobility aids) if they are needed. These include:  Canes.  Walkers.  Scooters.  Crutches.  Turn on the lights when you go into a dark area. Replace any light bulbs as soon as they burn out.  Set up your furniture so you have a clear path. Avoid moving your furniture around.  If any of your floors are uneven, fix them.  If there are any pets around you, be aware of where they are.  Review your medicines with your doctor. Some medicines can make you feel dizzy. This can increase your chance of falling. Ask your doctor what other things that you can do to help prevent falls. This information is not intended to replace advice given to you by your health care provider. Make sure you discuss any questions you have with your health care provider. Document Released: 10/19/2008 Document Revised: 05/31/2015 Document Reviewed: 01/27/2014 Elsevier Interactive Patient Education  2017 [employer].

## 2019-08-30 NOTE — Progress Notes (Signed)
Subjective:   Donald Montgomery is a 74 y.o. male who presents for Medicare Annual/Subsequent preventive examination.  I connected with Mertha Finders today by telephone and verified that I am speaking with the correct person using two identifiers. Location patient: home Location provider: work Persons participating in the virtual visit: patient, provider.   I discussed the limitations, risks, security and privacy concerns of performing an evaluation and management service by telephone and the availability of in person appointments. I also discussed with the patient that there may be a patient responsible charge related to this service. The patient expressed understanding and verbally consented to this telephonic visit.    Interactive audio and video telecommunications were attempted between this provider and patient, however failed, due to patient having technical difficulties OR patient did not have access to video capability.  We continued and completed visit with audio only.      Review of Systems    N/A Cardiac Risk Factors include: advanced age (>74men, >59 women);male gender     Objective:    Today's Vitals   There is no height or weight on file to calculate BMI.  Advanced Directives 08/30/2019 07/01/2017 03/04/2016  Does Patient Have a Medical Advance Directive? Yes Yes Yes  Type of Paramedic of [place];Living will - -  Does patient want to make changes to medical advance directive? No - Patient declined - -  Copy of [place] in Chart? No - copy requested - -    Current Medications (verified) Outpatient Encounter Medications as of 08/30/2019  Medication Sig  . ANDROGEL PUMP 20.25 MG/ACT (1.62%) GEL Apply 4 application topically See admin instructions. Apply 2 pumps to each shoulder once a day  . b complex vitamins tablet Take 1 tablet by mouth daily with supper.  Marland Kitchen Black Elderberry (SAMBUCUS ELDERBERRY PO) Take 1 tablet by  mouth daily.   . Cholecalciferol (VITAMIN D-3) 25 MCG (1000 UT) CAPS Take 2,000 Units by mouth daily with supper.  . Coenzyme Q10 (CO Q 10 PO) Take 1 capsule by mouth daily.  Marland Kitchen ibuprofen (ADVIL) 200 MG tablet Take 200 mg by mouth every 6 (six) hours as needed for headache or mild pain.  . L-ARGININE PO Take 1 tablet by mouth daily with lunch.  Marland Kitchen MAGNESIUM PO Take 1 tablet by mouth daily with supper.  . Omega-3 Fatty Acids (FISH OIL) 1000 MG CAPS Take 1,000 mg by mouth daily with supper.   . Probiotic Product (ALIGN) 4 MG CAPS Take 4 mg by mouth daily.  . tadalafil (CIALIS) 5 MG tablet Take 10-15 mg by mouth daily as needed for erectile dysfunction.   . docusate sodium (COLACE) 100 MG capsule Take 1 capsule (100 mg total) by mouth daily as needed for mild constipation. (Patient not taking: Reported on 08/30/2019)  . polyethylene glycol (MIRALAX) 17 g packet Take 17 g by mouth daily as needed for moderate constipation. (Patient not taking: Reported on 08/30/2019)   No facility-administered encounter medications on file as of 08/30/2019.    Allergies (verified) Patient has no known allergies.   History: Past Medical History:  Diagnosis Date  . Cancer [facility])    Past Surgical History:  Procedure Laterality Date  . TONSILLECTOMY     Family History  Problem Relation Age of Onset  . Heart disease Mother   . Stroke Brother   . Heart disease Brother   . Allergic rhinitis Neg Hx   . Angioedema Neg Hx   .  Asthma Neg Hx   . Eczema Neg Hx   . Immunodeficiency Neg Hx   . Urticaria Neg Hx    Social History   Socioeconomic History  . Marital status: Married    Spouse name: Not on file  . Number of children: Not on file  . Years of education: Not on file  . Highest education level: Not on file  Occupational History  . Not on file  Tobacco Use  . Smoking status: Former Smoker    Packs/day: 18.00    Years: 1.00    Pack years: 18.00    Types: Cigarettes, Pipe  . Smokeless tobacco: Never  Used  . Tobacco comment: will consider AAA check as he states he smoked over 100 cigerattes   Substance and Sexual Activity  . Alcohol use: No  . Drug use: No  . Sexual activity: Not on file  Other Topics Concern  . 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 Difficulty of Paying Living Expenses: Not hard at all  Food Insecurity: No Food Insecurity  . Worried About Charity fundraiser in the Last Year: Never true  . Ran Out of Food in the Last Year: Never true  Transportation Needs: No Transportation Needs  . Lack of Transportation (Medical): No  . Lack of Transportation (Non-Medical): No  Physical Activity: Sufficiently Active  . Days of Exercise per Week: 7 days  . Minutes of Exercise per Session: 60 min  Stress: No Stress Concern Present  . Feeling of Stress : Not at all  Social Connections: Socially Integrated  . Frequency of Communication with Friends and Family: Once a week  . Frequency of Social Gatherings with Friends and Family: More than three times a week  . Attends Religious Services: More than 4 times per year  . Active Member of Clubs or Organizations: Yes  . Attends Archivist Meetings: More than 4 times per year  . Marital Status: Married    Tobacco Counseling Counseling given: Not Answered Comment: will consider AAA check as he states he smoked over 100 cigerattes    Clinical Intake:  Pre-visit preparation completed: Yes  Pain : No/denies pain     Nutritional Risks: None Diabetes: No  How often do you need to have someone help you when you read instructions, pamphlets, or other written materials from your doctor or pharmacy?: 1 - Never  Diabetic?No  Interpreter Needed?: No  Comments: College Information entered by :: [place] of Daily Living In your present  state of health, do you have any difficulty performing the following activities: 08/30/2019  Hearing? N  Vision? N  Difficulty concentrating or making decisions? N  Walking or climbing stairs? N  Dressing or bathing? N  Doing errands, shopping? N  Preparing Food and eating ? N  Using the Toilet? N  In the past six months, have you accidently leaked urine? N  Do you have problems with loss of bowel control? N  Managing your Medications? N  Managing your Finances? N  Housekeeping or managing your Housekeeping? N  Some recent data might be hidden    Patient Care Team: Billie Ruddy, MD as PCP - General (Family Medicine) Irine Seal, MD as Attending Physician (Urology)  Indicate any recent  Medical Services you may have received from other than [facility] providers in the past year (date may be approximate).     Assessment:   This is a routine wellness examination for [place].  Hearing/Vision screen  Hearing Screening   125Hz  250Hz  500Hz  1000Hz  2000Hz  3000Hz  4000Hz  6000Hz  8000Hz   Right ear:           Left ear:           Vision Screening Comments: Patient states gets eye checked annually    Dietary issues and exercise activities discussed: Current Exercise Habits: Home exercise routine, Type of exercise: walking, Time (Minutes): 60, Frequency (Times/Week): 5, Weekly Exercise (Minutes/Week): 300, Intensity: Moderate, Exercise limited by: None identified  Goals    . patient     Continue to walk and stay healthy Continue to use  Your weights x 2 per week     . Patient Stated     I will continue to walk 3-4 miles once day    . to maintain     Lift weight a couple of days a week       Depression Screen PHQ 2/9 Scores 08/30/2019 07/01/2017 03/04/2016 02/26/2015  PHQ - 2 Score 0 0 0 0  PHQ- 9 Score 0 - - -    Fall Risk Fall Risk  08/30/2019 07/01/2017 03/04/2016 02/26/2015  Falls in the past year? 0 No No No  Number falls in past yr: 0 - - -  Injury with Fall? 0 - - -  Risk for  fall due to : Medication side effect - - -  Follow up Falls evaluation completed;Falls prevention discussed - - -    Any stairs in or around the home? Yes  If so, are there any without handrails? No  Home free of loose throw rugs in walkways, pet beds, electrical cords, etc? Yes  Adequate lighting in your home to reduce risk of falls? Yes   ASSISTIVE DEVICES UTILIZED TO PREVENT FALLS:  Life alert? No  Use of a cane, walker or w/c? No  Grab bars in the bathroom? No  Shower chair or bench in shower? Yes  Elevated toilet seat or a handicapped toilet? No     Cognitive Function: MMSE - Mini Mental State Exam 07/01/2017 03/04/2016  Not completed: (No Data) (No Data)     6CIT Screen 08/30/2019 07/01/2017  What Year? 0 points 0 points  What month? 0 points 0 points  What time? 0 points 0 points  Count back from 20 0 points 0 points  Months in reverse 0 points 0 points  Repeat phrase 0 points 0 points  Total Score 0 0    Immunizations Immunization History  Administered Date(s) Administered  . Fluad Quad(high Dose 65+) 09/06/2018  . Influenza Whole 10/10/2009  . Influenza, High Dose Seasonal PF 10/13/2013, 11/28/2014, 10/10/2015, 09/09/2016, 10/19/2017  . Moderna SARS-COVID-2 Vaccination 02/01/2019, 03/01/2019  . Pneumococcal Conjugate-13 02/26/2015  . Pneumococcal Polysaccharide-23 12/01/2011  . Tdap 01/09/2012  . Zoster 01/09/2012    TDAP status: Up to date Flu Vaccine status: Up to date Pneumococcal vaccine status: Up to date Covid-19 vaccine status: Completed vaccines  Qualifies for Shingles Vaccine? Yes   Zostavax completed Yes   Shingrix Completed?: No.    Education has been provided regarding the importance of this vaccine. Patient has been advised to call insurance company to determine out of pocket expense if they have not yet received this vaccine. Advised may also receive vaccine at local pharmacy or Health Dept.  Verbalized acceptance and understanding.  Screening  Tests Health Maintenance  Topic Date Due  . COLONOSCOPY  11/15/2018  . INFLUENZA VACCINE  08/07/2019  . TETANUS/TDAP  01/08/2022  . COVID-19 Vaccine  Completed  . Hepatitis C Screening  Completed  . PNA vac Low Risk Adult  Completed    Health Maintenance  Health Maintenance Due  Topic Date Due  . COLONOSCOPY  11/15/2018  . INFLUENZA VACCINE  08/07/2019    Colorectal cancer screening: Completed 11/07/2008. Repeat every 10 years  Lung Cancer Screening: (Low Dose CT Chest recommended if Age 42-80 years, 30 pack-year currently smoking OR have quit w/in 15years.) does not qualify.   Lung Cancer Screening Referral: N/A  Additional Screening:  Hepatitis C Screening: does qualify; Completed 02/26/2015  Vision Screening: Recommended annual ophthalmology exams for early detection of glaucoma and other disorders of the eye. Is the patient up to date with their annual eye exam?  Yes  Who is the provider or what is the name of the office in which the patient attends annual eye exams? Dr. Durward Fortes  If pt is not established with a provider, would they like to be referred to a provider to establish care? No .   Dental Screening: Recommended annual dental exams for proper oral hygiene  Community Resource Referral / Chronic Care Management: CRR required this visit?  No   CCM required this visit?  No      Plan:     I have personally reviewed and noted the following in the patient's chart:   . Medical and social history . Use of alcohol, tobacco or illicit drugs  . Current medications and supplements . Functional ability and status . Nutritional status . Physical activity . Advanced directives . List of other physicians . Hospitalizations, surgeries, and ER visits in previous 12 months . Vitals . Screenings to include cognitive, depression, and falls . Referrals and appointments  In addition, I have reviewed and discussed with patient certain preventive protocols, quality  metrics, and best practice recommendations. A written personalized care plan for preventive services as well as general preventive health recommendations were provided to patient.     Ofilia Neas, LPN   3/71/0626   Nurse Notes: None

## 2019-08-31 DIAGNOSIS — N5201 Erectile dysfunction due to arterial insufficiency: Secondary | ICD-10-CM | POA: Diagnosis not present

## 2019-08-31 DIAGNOSIS — N401 Enlarged prostate with lower urinary tract symptoms: Secondary | ICD-10-CM | POA: Diagnosis not present

## 2019-08-31 DIAGNOSIS — E291 Testicular hypofunction: Secondary | ICD-10-CM | POA: Diagnosis not present

## 2019-08-31 DIAGNOSIS — R3915 Urgency of urination: Secondary | ICD-10-CM | POA: Diagnosis not present

## 2019-09-09 ENCOUNTER — Encounter: Payer: Self-pay | Admitting: Family Medicine

## 2019-09-09 DIAGNOSIS — H532 Diplopia: Secondary | ICD-10-CM | POA: Diagnosis not present

## 2019-09-09 DIAGNOSIS — H5022 Vertical strabismus, left eye: Secondary | ICD-10-CM | POA: Diagnosis not present

## 2019-09-09 DIAGNOSIS — H518 Other specified disorders of binocular movement: Secondary | ICD-10-CM | POA: Diagnosis not present

## 2019-09-09 DIAGNOSIS — H4912 Fourth [trochlear] nerve palsy, left eye: Secondary | ICD-10-CM | POA: Diagnosis not present

## 2019-09-30 DIAGNOSIS — Z23 Encounter for immunization: Secondary | ICD-10-CM | POA: Diagnosis not present

## 2019-11-02 DIAGNOSIS — Z23 Encounter for immunization: Secondary | ICD-10-CM | POA: Diagnosis not present

## 2019-12-07 DIAGNOSIS — D2271 Melanocytic nevi of right lower limb, including hip: Secondary | ICD-10-CM | POA: Diagnosis not present

## 2019-12-07 DIAGNOSIS — Z85828 Personal history of other malignant neoplasm of skin: Secondary | ICD-10-CM | POA: Diagnosis not present

## 2019-12-07 DIAGNOSIS — Z86018 Personal history of other benign neoplasm: Secondary | ICD-10-CM | POA: Diagnosis not present

## 2019-12-07 DIAGNOSIS — D485 Neoplasm of uncertain behavior of skin: Secondary | ICD-10-CM | POA: Diagnosis not present

## 2019-12-07 DIAGNOSIS — D225 Melanocytic nevi of trunk: Secondary | ICD-10-CM | POA: Diagnosis not present

## 2019-12-07 DIAGNOSIS — L570 Actinic keratosis: Secondary | ICD-10-CM | POA: Diagnosis not present

## 2019-12-07 DIAGNOSIS — L814 Other melanin hyperpigmentation: Secondary | ICD-10-CM | POA: Diagnosis not present

## 2019-12-07 DIAGNOSIS — L821 Other seborrheic keratosis: Secondary | ICD-10-CM | POA: Diagnosis not present

## 2019-12-07 DIAGNOSIS — L578 Other skin changes due to chronic exposure to nonionizing radiation: Secondary | ICD-10-CM | POA: Diagnosis not present

## 2020-02-09 DIAGNOSIS — H5022 Vertical strabismus, left eye: Secondary | ICD-10-CM | POA: Diagnosis not present

## 2020-02-09 DIAGNOSIS — H25013 Cortical age-related cataract, bilateral: Secondary | ICD-10-CM | POA: Diagnosis not present

## 2020-02-09 DIAGNOSIS — H518 Other specified disorders of binocular movement: Secondary | ICD-10-CM | POA: Diagnosis not present

## 2020-02-09 DIAGNOSIS — H4912 Fourth [trochlear] nerve palsy, left eye: Secondary | ICD-10-CM | POA: Diagnosis not present

## 2020-02-09 DIAGNOSIS — H532 Diplopia: Secondary | ICD-10-CM | POA: Diagnosis not present

## 2020-02-13 DIAGNOSIS — E291 Testicular hypofunction: Secondary | ICD-10-CM | POA: Diagnosis not present

## 2020-02-20 DIAGNOSIS — R3912 Poor urinary stream: Secondary | ICD-10-CM | POA: Diagnosis not present

## 2020-02-20 DIAGNOSIS — N5201 Erectile dysfunction due to arterial insufficiency: Secondary | ICD-10-CM | POA: Diagnosis not present

## 2020-02-20 DIAGNOSIS — N401 Enlarged prostate with lower urinary tract symptoms: Secondary | ICD-10-CM | POA: Diagnosis not present

## 2020-02-20 DIAGNOSIS — E291 Testicular hypofunction: Secondary | ICD-10-CM | POA: Diagnosis not present

## 2020-04-19 DIAGNOSIS — Z23 Encounter for immunization: Secondary | ICD-10-CM | POA: Diagnosis not present

## 2020-08-13 ENCOUNTER — Telehealth: Payer: Self-pay | Admitting: Family Medicine

## 2020-08-13 NOTE — Telephone Encounter (Signed)
Left message for patient to call back and schedule Medicare Annual Wellness Visit (AWV) either virtually or in office.  Left both  my jabber number [phone] and office number    Last AWV 08/30/19 please schedule at anytime with [facility] Nurse Health Advisor 1 or 2   This should be a 45 minute visit.

## 2020-10-10 DIAGNOSIS — Z23 Encounter for immunization: Secondary | ICD-10-CM | POA: Diagnosis not present

## 2020-10-15 DIAGNOSIS — E291 Testicular hypofunction: Secondary | ICD-10-CM | POA: Diagnosis not present

## 2020-10-15 DIAGNOSIS — N401 Enlarged prostate with lower urinary tract symptoms: Secondary | ICD-10-CM | POA: Diagnosis not present

## 2020-10-15 LAB — PSA: PSA: 0.98

## 2020-10-22 DIAGNOSIS — E291 Testicular hypofunction: Secondary | ICD-10-CM | POA: Diagnosis not present

## 2020-10-22 DIAGNOSIS — N5201 Erectile dysfunction due to arterial insufficiency: Secondary | ICD-10-CM | POA: Diagnosis not present

## 2020-10-24 ENCOUNTER — Encounter: Payer: Self-pay | Admitting: Family Medicine

## 2020-11-03 DIAGNOSIS — Z23 Encounter for immunization: Secondary | ICD-10-CM | POA: Diagnosis not present

## 2020-11-19 ENCOUNTER — Ambulatory Visit (INDEPENDENT_AMBULATORY_CARE_PROVIDER_SITE_OTHER): Payer: Medicare Other | Admitting: Family Medicine

## 2020-11-19 VITALS — BP 112/74 | HR 58 | Temp 98.0°F | Wt 188.8 lb

## 2020-11-19 DIAGNOSIS — R42 Dizziness and giddiness: Secondary | ICD-10-CM

## 2020-11-19 DIAGNOSIS — I700 Atherosclerosis of aorta: Secondary | ICD-10-CM | POA: Diagnosis not present

## 2020-11-19 DIAGNOSIS — Z0000 Encounter for general adult medical examination without abnormal findings: Secondary | ICD-10-CM

## 2020-11-19 DIAGNOSIS — R058 Other specified cough: Secondary | ICD-10-CM | POA: Diagnosis not present

## 2020-11-19 DIAGNOSIS — S90212A Contusion of left great toe with damage to nail, initial encounter: Secondary | ICD-10-CM | POA: Diagnosis not present

## 2020-11-28 ENCOUNTER — Telehealth: Payer: Self-pay | Admitting: Family Medicine

## 2020-11-28 DIAGNOSIS — R0989 Other specified symptoms and signs involving the circulatory and respiratory systems: Secondary | ICD-10-CM

## 2020-11-28 NOTE — Telephone Encounter (Signed)
Patient called to ask for referral to ENT. Patient states him and Dr.Banks had previously spoke about excessive throat clearing and Dr.Banks said to try changing his diet first, patient states that this did not work and he would like to see an ENT. Patient would like a call when referral is placed.      Good callback number is [phone]    Please advise

## 2020-11-28 NOTE — Telephone Encounter (Signed)
Okay to refer? 

## 2020-12-03 ENCOUNTER — Encounter: Payer: Self-pay | Admitting: Family Medicine

## 2020-12-03 NOTE — Progress Notes (Signed)
Subjective:   Donald Montgomery is a 75 y.o. male who presents for Medicare Annual/Subsequent preventive examination.  Doing well overall. Pt endorses throat clearing that is worse after breakfast, bad when walking, but better by lunch.  Patient denies heartburn.  States drinks orange juice every morning for breakfast.  Patient denies symptoms in the evening.  Inquires about referral.  Patient also mentions left great toenail with discolored area after hitting toe on box spring 1 week ago.  Patient has some tenderness to the toe.  Patient did not seek medical attention after hitting his toe.  Denies pain with fall or inability to bend toe.  Patient requesting labs to be sent to [facility].  Review of Systems    ROS negative unless stated above.       Objective:    Today's Vitals   11/19/20 1108  BP: 112/74  Pulse: (!) 58  Temp: 98 F (36.7 C)  TempSrc: Oral  SpO2: 91%  Weight: 188 lb 12.8 oz (85.6 kg)   Body mass index is 27.09 kg/m.  Gen. Pleasant, well developed, well-nourished, in NAD HEENT - [place]/AT, PERRL, EOMI, conjunctive clear, no scleral icterus, no nasal drainage, pharynx without erythema or exudate. Neck: No JVD, no thyromegaly, no carotid bruits Lungs: no use of accessory muscles, CTAB, no wheezes, rales or rhonchi Cardiovascular: RRR,  No r/g/m, no peripheral edema Abdomen: BS present, soft, nontender, nondistended Musculoskeletal: No deformities, moves all four extremities, no cyanosis or clubbing, normal tone Neuro:  A&Ox3, CN II-XII intact, normal gait Skin:  Warm, dry, intact, no lesions.  Left great toenail with area of dried blood underneath at the base of cuticle.  Toenail slightly loose.  Mild TTP at cuticle.  No deformity.   Advanced Directives 08/30/2019 07/01/2017 03/04/2016  Does Patient Have a Medical Advance Directive? Yes Yes Yes  Type of Paramedic of [place];Living will - -  Does patient want to make changes to medical advance  directive? No - Patient declined - -  Copy of [place] in Chart? No - copy requested - -    Current Medications (verified) Outpatient Encounter Medications as of 11/19/2020  Medication Sig   ANDROGEL PUMP 20.25 MG/ACT (1.62%) GEL Apply 4 application topically See admin instructions. Apply 2 pumps to each shoulder once a day   b complex vitamins tablet Take 1 tablet by mouth daily with supper.   Black Elderberry (SAMBUCUS ELDERBERRY PO) Take 1 tablet by mouth daily.    Cholecalciferol (VITAMIN D-3) 25 MCG (1000 UT) CAPS Take 2,000 Units by mouth daily with supper.   Coenzyme Q10 (CO Q 10 PO) Take 1 capsule by mouth daily.   ibuprofen (ADVIL) 200 MG tablet Take 200 mg by mouth every 6 (six) hours as needed for headache or mild pain.   L-ARGININE PO Take 1 tablet by mouth daily with lunch.   MAGNESIUM PO Take 1 tablet by mouth daily with supper.   Omega-3 Fatty Acids (FISH OIL) 1000 MG CAPS Take 1,000 mg by mouth daily with supper.    Probiotic Product (ALIGN) 4 MG CAPS Take 4 mg by mouth daily.   tadalafil (CIALIS) 5 MG tablet Take 10-15 mg by mouth daily as needed for erectile dysfunction.    polyethylene glycol (MIRALAX) 17 g packet Take 17 g by mouth daily as needed for moderate constipation. (Patient not taking: No sig reported)   No facility-administered encounter medications on file as of 11/19/2020.    Allergies (verified)  Patient has no known allergies.   History: Past Medical History:  Diagnosis Date   Cancer [facility])    Past Surgical History:  Procedure Laterality Date   TONSILLECTOMY     Family History  Problem Relation Age of Onset   Heart disease Mother    Stroke Brother    Heart disease Brother    Allergic rhinitis Neg Hx    Angioedema Neg Hx    Asthma Neg Hx    Eczema Neg Hx    Immunodeficiency Neg Hx    Urticaria Neg Hx    Social History   Socioeconomic History   Marital status: Married    Spouse name: Not on file   Number of  children: Not on file   Years of education: Not on file   Highest education level: Not on file  Occupational History   Not on file  Tobacco Use   Smoking status: Former    Packs/day: 18.00    Years: 1.00    Pack years: 18.00    Types: Cigarettes, Pipe   Smokeless tobacco: Never   Tobacco comments:    will consider AAA check as he states he smoked over 100 cigerattes   Substance and Sexual Activity   Alcohol use: No   Drug use: No   Sexual activity: Not on file  Other Topics Concern   Not on file  Social History Narrative   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  Patient Care Team: Billie Ruddy, MD as PCP - General (Family Medicine) Irine Seal, MD as Attending Physician (Urology)  Indicate any recent Medical Services you may have received from other than [facility] providers in the past year (date may be approximate).     Assessment:   This is a routine wellness examination for Donald Montgomery.  Hearing/Vision screen No results found.  Dietary issues and exercise activities discussed:     Goals Addressed   None    Depression Screen PHQ 2/9 Scores 11/19/2020 08/30/2019 07/01/2017 03/04/2016 02/26/2015  PHQ - 2 Score 0 0 0 0 0  PHQ- 9 Score 0 0 - - -    Fall Risk Fall Risk  08/30/2019 07/01/2017 03/04/2016 02/26/2015  Falls in the past year? 0 No No No  Number falls in past yr: 0 - - -  Injury with Fall? 0 - - -  Risk for fall due to : Medication side effect - - -  Follow up Falls evaluation completed;Falls  prevention discussed - - -    FALL RISK PREVENTION PERTAINING TO THE HOME:  Any stairs in or around the home? Yes  If so, are there any without handrails? No  Home free of loose throw rugs in walkways, pet beds, electrical cords, etc? Yes  Adequate lighting in your home to reduce risk of falls? Yes   ASSISTIVE DEVICES UTILIZED TO PREVENT FALLS:  Life alert? No  Use of a cane, walker or w/c? No  Grab bars in the bathroom?  No  Shower chair or bench in shower? No  Elevated toilet seat or a handicapped toilet? No   TIMED UP AND GO:  Was the test performed? No .   Gait steady and fast without use of assistive device  Cognitive Function: MMSE - Mini Mental State Exam 07/01/2017 03/04/2016  Not completed: (No Data) (No Data)     6CIT Screen 08/30/2019 07/01/2017  What Year? 0 points 0 points  What month? 0 points 0 points  What time? 0 points 0 points  Count back from 20 0 points 0 points  Months in reverse 0 points 0 points  Repeat phrase 0 points 0 points  Total Score 0 0    Immunizations Immunization History  Administered Date(s) Administered   Fluad Quad(high Dose 65+) 09/06/2018   Influenza Whole 10/10/2009   Influenza, High Dose Seasonal PF 10/13/2013, 11/28/2014, 10/10/2015, 09/09/2016, 10/19/2017   Influenza-Unspecified 10/13/2013, 11/28/2014, 10/10/2015, 09/09/2016, 10/19/2017   Moderna Sars-Covid-2 Vaccination 02/01/2019, 03/01/2019, 11/02/2019, 04/19/2020, 11/03/2020   Pneumococcal Conjugate-13 02/26/2015   Pneumococcal Polysaccharide-23 12/01/2011   Tdap 01/09/2012   Zoster, Live 01/09/2012    TDAP status: Up to date  Flu Vaccine status: Up to date done in mid October 2022.  Pneumococcal vaccine status: Up to date  Covid-19 vaccine status: Completed vaccines  Qualifies for Shingles Vaccine? Yes   Zostavax completed No   Shingrix Completed?: No.    Education has been provided regarding the importance of this vaccine. Patient has been advised to call  insurance company to determine out of pocket expense if they have not yet received this vaccine. Advised may also receive vaccine at local pharmacy or Health Dept. Verbalized acceptance and understanding.  Screening Tests Health Maintenance  Topic Date Due   Zoster Vaccines- Shingrix (1 of 2) Never done   INFLUENZA VACCINE  08/06/2020   TETANUS/TDAP  01/08/2022   Pneumonia Vaccine 92+ Years old  Completed   COVID-19 Vaccine  Completed   Hepatitis C Screening  Completed   HPV VACCINES  Aged Out   COLONOSCOPY (Pts 45-36yrs Insurance coverage will need to be confirmed)  Discontinued    Health Maintenance  Health Maintenance Due  Topic Date Due   Zoster Vaccines- Shingrix (1 of 2) Never done   INFLUENZA VACCINE  08/06/2020    Colorectal cancer screening: No longer required.   Lung Cancer Screening: (Low Dose CT Chest recommended if Age 80-80 years, 30 pack-year currently smoking OR have quit w/in 15years.) does not qualify.   Additional Screening:  Hepatitis C Screening: does qualify; Completed 02/2015  Vision Screening: Recommended annual ophthalmology exams for early detection of glaucoma and other disorders of the eye. Is the patient up to date with their annual eye exam?  Yes   Dental Screening: Recommended annual dental exams for proper oral hygiene  Community Resource Referral / Chronic Care Management: CRR required this visit?  No   CCM required this visit?  No      Plan:    Medicare annual wellness visit, subsequent  Subungual hematoma of great toe of left foot, initial encounter -Stable -Given duration of symptoms and able to evacuate blood/clot -Advised toenail may come off -Given handout  - Plan: CBC with Differential/Platelet  Atherosclerosis of aorta (HCC) -Lifestyle modifications - Plan: Lipid panel  Other cough -Advised silent reflux due to orange juice may be causing symptoms -Patient advised to stop drinking orange juice every morning to see if  symptoms improve. -For continued symptoms we will place referral to ENT. -  Plan: Basic metabolic panel  Dizziness -Hydration encouraged -Consider meclizine as needed -For continued symptoms referral to ENT  I have personally reviewed and noted the following in the patient's chart:   Medical and social history Use of alcohol, tobacco or illicit drugs  Current medications and supplements including opioid prescriptions. Patient is not currently taking opioid prescriptions. Functional ability and status Nutritional status Physical activity Advanced directives List of other physicians Hospitalizations, surgeries, and ER visits in previous 12 months Vitals Screenings to include cognitive, depression, and falls Referrals and appointments  In addition, I have reviewed and discussed with patient certain preventive protocols, quality metrics, and best practice recommendations. A written personalized care plan for preventive services as well as general preventive health recommendations were provided to patient.   Billie Ruddy, MD   12/03/2020

## 2020-12-06 ENCOUNTER — Ambulatory Visit
Admission: EM | Admit: 2020-12-06 | Discharge: 2020-12-06 | Disposition: A | Discharge status: Home / Self Care | Payer: Medicare Other

## 2020-12-06 ENCOUNTER — Other Ambulatory Visit: Payer: Self-pay

## 2020-12-06 NOTE — ED Provider Notes (Signed)
  [facility]    CSN: [id number] Arrival date & time: 12/06/20  1114   Patient complained of stubbing his toe on November 6, states since then it turned multiple colors.  When patient removed his shoe to show me his left great toe, his left great toenail came off.  I advised him that I simply could not charge him for this visit.   Lynden Oxford Scales, PA-C 12/06/20 1434

## 2020-12-06 NOTE — ED Triage Notes (Signed)
Pt reports having pain to left great toe he states he jammed the toe last month. He states his left great toe is green.

## 2020-12-06 NOTE — Telephone Encounter (Signed)
That's fine

## 2020-12-07 NOTE — Addendum Note (Signed)
Addended by: Anderson Malta on: 12/07/2020 08:30 AM   Modules accepted: Orders

## 2020-12-10 ENCOUNTER — Other Ambulatory Visit (INDEPENDENT_AMBULATORY_CARE_PROVIDER_SITE_OTHER): Payer: Medicare Other

## 2020-12-10 DIAGNOSIS — S90212A Contusion of left great toe with damage to nail, initial encounter: Secondary | ICD-10-CM | POA: Diagnosis not present

## 2020-12-10 DIAGNOSIS — R058 Other specified cough: Secondary | ICD-10-CM

## 2020-12-10 DIAGNOSIS — I700 Atherosclerosis of aorta: Secondary | ICD-10-CM

## 2020-12-10 LAB — CBC WITH DIFFERENTIAL/PLATELET
Basophils Absolute: 0 10*3/uL (ref 0.0–0.1)
Basophils Relative: 0.6 % (ref 0.0–3.0)
Eosinophils Absolute: 0.1 10*3/uL (ref 0.0–0.7)
Eosinophils Relative: 1.7 % (ref 0.0–5.0)
HCT: 42.4 % (ref 39.0–52.0)
Hemoglobin: 14.2 g/dL (ref 13.0–17.0)
Lymphocytes Relative: 34.4 % (ref 12.0–46.0)
Lymphs Abs: 2 10*3/uL (ref 0.7–4.0)
MCHC: 33.6 g/dL (ref 30.0–36.0)
MCV: 93.7 fl (ref 78.0–100.0)
Monocytes Absolute: 0.6 10*3/uL (ref 0.1–1.0)
Monocytes Relative: 10.4 % (ref 3.0–12.0)
Neutro Abs: 3.1 10*3/uL (ref 1.4–7.7)
Neutrophils Relative %: 52.9 % (ref 43.0–77.0)
Platelets: 240 10*3/uL (ref 150.0–400.0)
RBC: 4.52 Mil/uL (ref 4.22–5.81)
RDW: 12.8 % (ref 11.5–15.5)
WBC: 5.9 10*3/uL (ref 4.0–10.5)

## 2020-12-10 LAB — BASIC METABOLIC PANEL
BUN: 27 mg/dL — ABNORMAL HIGH (ref 6–23)
CO2: 29 mEq/L (ref 19–32)
Calcium: 9.3 mg/dL (ref 8.4–10.5)
Chloride: 104 mEq/L (ref 96–112)
Creatinine, Ser: 0.9 mg/dL (ref 0.40–1.50)
GFR: 82.99 mL/min (ref >60.00)
Glucose, Bld: 108 mg/dL — ABNORMAL HIGH (ref 70–99)
Potassium: 4 mEq/L (ref 3.5–5.1)
Sodium: 139 mEq/L (ref 135–145)

## 2020-12-10 LAB — LIPID PANEL
Cholesterol: 167 mg/dL (ref 0–200)
HDL: 67.3 mg/dL (ref >39.00)
LDL Cholesterol: 86 mg/dL (ref 0–99)
NonHDL: 99.43
Total CHOL/HDL Ratio: 2
Triglycerides: 66 mg/dL (ref 0.0–149.0)
VLDL: 13.2 mg/dL (ref 0.0–40.0)

## 2020-12-10 NOTE — Addendum Note (Signed)
Addended by: Susy Manor on: 34/0/6840 07:21 AM   Modules accepted: Orders

## 2020-12-10 NOTE — Addendum Note (Signed)
Addended by: Susy Manor on: 90/09/3110 07:21 AM   Modules accepted: Orders

## 2020-12-11 DIAGNOSIS — L578 Other skin changes due to chronic exposure to nonionizing radiation: Secondary | ICD-10-CM | POA: Diagnosis not present

## 2020-12-11 DIAGNOSIS — D485 Neoplasm of uncertain behavior of skin: Secondary | ICD-10-CM | POA: Diagnosis not present

## 2020-12-11 DIAGNOSIS — L814 Other melanin hyperpigmentation: Secondary | ICD-10-CM | POA: Diagnosis not present

## 2020-12-11 DIAGNOSIS — L570 Actinic keratosis: Secondary | ICD-10-CM | POA: Diagnosis not present

## 2020-12-11 DIAGNOSIS — D2271 Melanocytic nevi of right lower limb, including hip: Secondary | ICD-10-CM | POA: Diagnosis not present

## 2020-12-11 DIAGNOSIS — D225 Melanocytic nevi of trunk: Secondary | ICD-10-CM | POA: Diagnosis not present

## 2020-12-11 DIAGNOSIS — Z23 Encounter for immunization: Secondary | ICD-10-CM | POA: Diagnosis not present

## 2020-12-11 DIAGNOSIS — D0439 Carcinoma in situ of skin of other parts of face: Secondary | ICD-10-CM | POA: Diagnosis not present

## 2020-12-11 DIAGNOSIS — Z85828 Personal history of other malignant neoplasm of skin: Secondary | ICD-10-CM | POA: Diagnosis not present

## 2020-12-11 DIAGNOSIS — Z86018 Personal history of other benign neoplasm: Secondary | ICD-10-CM | POA: Diagnosis not present

## 2020-12-11 DIAGNOSIS — L821 Other seborrheic keratosis: Secondary | ICD-10-CM | POA: Diagnosis not present

## 2020-12-11 DIAGNOSIS — C44311 Basal cell carcinoma of skin of nose: Secondary | ICD-10-CM | POA: Diagnosis not present

## 2021-01-15 DIAGNOSIS — D0439 Carcinoma in situ of skin of other parts of face: Secondary | ICD-10-CM | POA: Diagnosis not present

## 2021-01-17 DIAGNOSIS — B079 Viral wart, unspecified: Secondary | ICD-10-CM | POA: Diagnosis not present

## 2021-01-17 DIAGNOSIS — Z23 Encounter for immunization: Secondary | ICD-10-CM | POA: Diagnosis not present

## 2021-01-17 DIAGNOSIS — D485 Neoplasm of uncertain behavior of skin: Secondary | ICD-10-CM | POA: Diagnosis not present

## 2021-02-26 DIAGNOSIS — C44311 Basal cell carcinoma of skin of nose: Secondary | ICD-10-CM | POA: Diagnosis not present

## 2021-03-18 DIAGNOSIS — K219 Gastro-esophageal reflux disease without esophagitis: Secondary | ICD-10-CM | POA: Diagnosis not present

## 2021-04-17 DIAGNOSIS — E291 Testicular hypofunction: Secondary | ICD-10-CM | POA: Diagnosis not present

## 2021-04-18 DIAGNOSIS — H5022 Vertical strabismus, left eye: Secondary | ICD-10-CM | POA: Diagnosis not present

## 2021-04-18 DIAGNOSIS — H25013 Cortical age-related cataract, bilateral: Secondary | ICD-10-CM | POA: Diagnosis not present

## 2021-04-18 DIAGNOSIS — H532 Diplopia: Secondary | ICD-10-CM | POA: Diagnosis not present

## 2021-04-24 DIAGNOSIS — N5201 Erectile dysfunction due to arterial insufficiency: Secondary | ICD-10-CM | POA: Diagnosis not present

## 2021-04-24 DIAGNOSIS — E291 Testicular hypofunction: Secondary | ICD-10-CM | POA: Diagnosis not present

## 2021-04-24 DIAGNOSIS — N400 Enlarged prostate without lower urinary tract symptoms: Secondary | ICD-10-CM | POA: Diagnosis not present

## 2021-08-02 DIAGNOSIS — M25571 Pain in right ankle and joints of right foot: Secondary | ICD-10-CM | POA: Diagnosis not present

## 2021-08-13 ENCOUNTER — Other Ambulatory Visit: Payer: Self-pay

## 2021-08-13 ENCOUNTER — Ambulatory Visit: Payer: Medicare Other | Attending: Orthopaedic Surgery

## 2021-08-13 DIAGNOSIS — M6281 Muscle weakness (generalized): Secondary | ICD-10-CM

## 2021-08-13 DIAGNOSIS — M25571 Pain in right ankle and joints of right foot: Secondary | ICD-10-CM

## 2021-08-13 DIAGNOSIS — R293 Abnormal posture: Secondary | ICD-10-CM | POA: Diagnosis not present

## 2021-08-13 DIAGNOSIS — R262 Difficulty in walking, not elsewhere classified: Secondary | ICD-10-CM | POA: Diagnosis not present

## 2021-08-13 NOTE — Therapy (Signed)
OUTPATIENT PHYSICAL THERAPY LOWER EXTREMITY EVALUATION   Patient Name: Donald Montgomery MRN: [id number] DOB:August 05, 1945, 76 y.o., male Today's Date: 08/13/2021   PT End of Session - 08/13/21 1630     Visit Number 1    Date for PT Re-Evaluation 10/08/21    Authorization Type MEDICARE PART A AND B    PT Start Time 1625    PT Stop Time 1710    PT Time Calculation (min) 45 min    Activity Tolerance Patient tolerated treatment well    Behavior During Therapy [facility] for tasks assessed/performed             Past Medical History:  Diagnosis Date   Cancer [facility])    Past Surgical History:  Procedure Laterality Date   TONSILLECTOMY     Patient Active Problem List   Diagnosis Date Noted   SBO (small bowel obstruction) (HCC) 02/04/2019   Chronic throat clearing 04/14/2016   Chronic rhinitis 04/14/2016   Mild acid reflux 04/14/2016   Inguinal hernia, left 06/22/2013   Testosterone deficiency 04/29/2013   Erectile dysfunction 04/29/2013    PCP: Deeann Saint, MD  REFERRING PROVIDER: Bjorn Pippin, MD   REFERRING DIAG: M76.60 (ICD-10-CM) - Achilles tendonitis   THERAPY DIAG:  Pain in right ankle and joints of right foot  Difficulty in walking, not elsewhere classified  Abnormal posture  Muscle weakness (generalized)  Rationale for Evaluation and Treatment Rehabilitation  ONSET DATE: Deeann Saint, MD  SUBJECTIVE:   SUBJECTIVE STATEMENT: Patient states he has been having right heel and foot pain for approx 2-3 months.  He locates this pain at the back of the heel.  He is retired and walks daily with his wife.  They occasionally do some strengthening exercises with weights at home.  He recalls recently going [place] to do some work and going up and down the steps multiple times carrying some heavy items.  He feels this could have been when he started having the pain.  He is able to walk daily and has only mild pain when walking but post rest, he limps for  several minutes before being able to walk without pain.  First of the morning is usually his worst pain.  He has used ice and has done a few stretches that Dr. Everardo Pacific prescribed.  He states he is slightly improved but still having post rest pain.    PERTINENT HISTORY: Observable "pump bump" right heel.    PAIN:  Are you having pain? Yes: NPRS scale: 2/10 Pain location: right heel Pain description: intermittent Aggravating factors: walking (post walk) Relieving factors: ice, advil  PRECAUTIONS: None  WEIGHT BEARING RESTRICTIONS No  FALLS:  Has patient fallen in last 6 months? No  LIVING ENVIRONMENT: Lives with: lives with their spouse Lives in: House/apartment Stairs: Yes: Internal: 12 steps; on left going up and can reach both and External: 5 steps; can reach both Has following equipment at home: None  OCCUPATION: Retired  PLOF: Independent  PATIENT GOALS to be able to walk without pain   OBJECTIVE:   DIAGNOSTIC FINDINGS: none  PATIENT SURVEYS:  FOTO not entered, will enter and administer next visit  COGNITION:  Overall cognitive status: Within functional limits for tasks assessed     SENSATION: WFL  EDEMA:  Patient has mild edema right ankle but non tender at any of the lateral or medial structures.     POSTURE:  bilateral foot pronation and decreased arch  PALPATION: Tender at  insertion of right achilles  LOWER EXTREMITY ROM:  Right ankle DF 20 degrees, PF:  WNL  LOWER EXTREMITY MMT:  All right LE musculature generally 4+ to 5/5  LOWER EXTREMITY SPECIAL TESTS:  Ankle special tests: Homan's test: negative  FUNCTIONAL TESTS:  5 times sit to stand: administer next visit: time constraints Timed up and go (TUG): administer next visit ; time constraints  GAIT: Distance walked: 50 Assistive device utilized: None Level of assistance: Complete Independence Comments: antalgic    TODAY'S TREATMENT: Initiated HEP   PATIENT EDUCATION:  Education  details: Initiated HEP Person educated: Patient Education method: Programmer, multimedia, Facilities manager, Verbal cues, and Handouts Education comprehension: verbalized understanding, returned demonstration, and verbal cues required   HOME EXERCISE PROGRAM: Access Code: [email or web address] URL: [email or web address] Date: 08/13/2021 Prepared by: Mikey Kirschner  Exercises - Supine Knee Extension Mobilization with Weight  - 2 x daily - 7 x weekly - 1 sets - 1 reps - Supine Ankle Pumps  - 2 x daily - 7 x weekly - 1 sets - 30 reps - Supine Quadricep Sets  - 2 x daily - 7 x weekly - 1 sets - 20 reps - Supine Heel Slide  - 2 x daily - 7 x weekly - 3 sets - 10 reps - Supine Hip Abduction  - 2 x daily - 7 x weekly - 3 sets - 10 reps - Small Range Straight Leg Raise  - 1 x daily - 7 x weekly - 3 sets - 10 reps - Seated Long Arc Quad  - 1 x daily - 7 x weekly - 3 sets - 10 reps  ASSESSMENT:  CLINICAL IMPRESSION: Patient is a 76 y.o. male who was seen today for physical therapy evaluation and treatment for right achilles tendinitis.  He presents with decreased DF ROM right ankle, tenderness to palpation at insertion of right achilles, and antalgic gait.  He is fairly functional but experiences pain post rest to the point of limping and needing daily Ibuprofen along with ice.  He is fairly active, walking daily and would like to be able to continue his current activity level without pain and avoid having to continue Ibuprofen as he understands that this is contraindicated to take for long periods of time.     OBJECTIVE IMPAIRMENTS Abnormal gait, decreased knowledge of condition, difficulty walking, decreased ROM, decreased strength, impaired flexibility, postural dysfunction, and pain.   ACTIVITY LIMITATIONS carrying, lifting, bending, standing, squatting, sleeping, and stairs  PARTICIPATION LIMITATIONS: cleaning, laundry, driving, shopping, community activity, and yard work  PERSONAL FACTORS Age and  Fitness are also affecting patient's functional outcome.   REHAB POTENTIAL: Good  CLINICAL DECISION MAKING: Stable/uncomplicated  EVALUATION COMPLEXITY: Low   GOALS: Goals reviewed with patient? Yes  SHORT TERM GOALS: Target date: 09/10/2021  Pain report to be no greater than 4/10  Baseline: Goal status: INITIAL  2.  Patient will be independent with initial HEP  Baseline:  Goal status: INITIAL  LONG TERM GOALS: Target date: 10/08/2021   Patient to report pain no greater than 2/10  Baseline:  Goal status: INITIAL  2.  Patient to be independent with advanced HEP  Baseline:  Goal status: INITIAL  3.  Patient to be able to walk 3 miles without need to stop due to heel pain Baseline:  Goal status: INITIAL  4.  Patient to be able to demonstrate normal, non antalgic gait  Baseline:  Goal status: INITIAL  5.  Patient to be able to discontinue anti-inflammatories Baseline:  Goal status: INITIAL    PLAN: PT FREQUENCY: 1-2x/week  PT DURATION: 8 weeks  PLANNED INTERVENTIONS: Therapeutic exercises, Therapeutic activity, Neuromuscular re-education, Balance training, Gait training, Patient/Family education, Self Care, Joint mobilization, Stair training, Aquatic Therapy, Dry Needling, Electrical stimulation, Cryotherapy, Moist heat, Splintting, Taping, Vasopneumatic device, Ultrasound, Ionotophoresis 4mg /ml Dexamethasone, Manual therapy, and Re-evaluation  PLAN FOR NEXT SESSION: Administer FOTO, 5 times sit to stand and TUG.  Review HEP.  Add in prostretch and/or rockerboard, single leg stance activity.     Victorino Dike B. Weslynn Ke, PT 08/13/21 6:05 PM  [facility] [address], [address] [place], [place] [address] Phone # [phone] Fax [phone]

## 2021-08-19 ENCOUNTER — Ambulatory Visit: Payer: Medicare Other | Admitting: Physical Therapy

## 2021-08-19 ENCOUNTER — Encounter: Payer: Self-pay | Admitting: Physical Therapy

## 2021-08-19 DIAGNOSIS — R262 Difficulty in walking, not elsewhere classified: Secondary | ICD-10-CM | POA: Diagnosis not present

## 2021-08-19 DIAGNOSIS — M6281 Muscle weakness (generalized): Secondary | ICD-10-CM | POA: Diagnosis not present

## 2021-08-19 DIAGNOSIS — M25571 Pain in right ankle and joints of right foot: Secondary | ICD-10-CM | POA: Diagnosis not present

## 2021-08-19 DIAGNOSIS — R293 Abnormal posture: Secondary | ICD-10-CM

## 2021-08-19 NOTE — Therapy (Signed)
OUTPATIENT PHYSICAL THERAPY TREATMENT NOTE   Patient Name: Donald Montgomery MRN: [id number] DOB:04-28-1945, 76 y.o., male Today's Date: 08/19/2021  PCP:  Deeann Saint, MD REFERRING PROVIDER: Bjorn Pippin, MD   END OF SESSION:   PT End of Session - 08/19/21 1231     Visit Number 2    Date for PT Re-Evaluation 10/08/21    Authorization Type MEDICARE PART A AND B    PT Start Time 1231    PT Stop Time 1310    PT Time Calculation (min) 39 min    Activity Tolerance Patient tolerated treatment well    Behavior During Therapy WFL for tasks assessed/performed             Past Medical History:  Diagnosis Date   Cancer [facility])    Past Surgical History:  Procedure Laterality Date   TONSILLECTOMY     Patient Active Problem List   Diagnosis Date Noted   SBO (small bowel obstruction) (HCC) 02/04/2019   Chronic throat clearing 04/14/2016   Chronic rhinitis 04/14/2016   Mild acid reflux 04/14/2016   Inguinal hernia, left 06/22/2013   Testosterone deficiency 04/29/2013   Erectile dysfunction 04/29/2013    REFERRING DIAG:  M76.60 (ICD-10-CM) - Achilles tendonitis     THERAPY DIAG:  Pain in right ankle and joints of right foot  Difficulty in walking, not elsewhere classified  Abnormal posture  Muscle weakness (generalized)  Rationale for Evaluation and Treatment Rehabilitation  PERTINENT HISTORY: Observable "pump bump" right heel.   PRECAUTIONS: None  SUBJECTIVE: Pain comes and goes.   PAIN:  Are you having pain? No   OBJECTIVE: (objective measures completed at initial evaluation unless otherwise dated)   DIAGNOSTIC FINDINGS: none   PATIENT SURVEYS:  FOTO not entered, will enter and administer next visit   COGNITION:           Overall cognitive status: Within functional limits for tasks assessed                          SENSATION: WFL   EDEMA:  Patient has mild edema right ankle but non tender at any of the lateral or medial structures.        POSTURE:  bilateral foot pronation and decreased arch   PALPATION: Tender at insertion of right achilles   LOWER EXTREMITY ROM:   Right ankle DF 20 degrees, PF:  WNL   LOWER EXTREMITY MMT:   All right LE musculature generally 4+ to 5/5   LOWER EXTREMITY SPECIAL TESTS:  Ankle special tests: Homan's test: negative   FUNCTIONAL TESTS:  5 times sit to stand: administer next visit: time constraints Timed up and go (TUG): administer next visit ; time constraints   GAIT: Distance walked: 50 Assistive device utilized: None Level of assistance: Complete Independence Comments: antalgic       TODAY'S TREATMENT: 08/19/21 Review of initial HEP: pt correctly performed all exercises Manual; Gastoc soft tissue work. Pt prone. Tissue supple with no TP Ionto to Rt achilles: 1 ml of dexamethasone 6 hr wear. Skin intact   Initiated HEP     PATIENT EDUCATION:  Education details: Initiated HEP Person educated: Patient Education method: Programmer, multimedia, Facilities manager, Verbal cues, and Handouts Education comprehension: verbalized understanding, returned demonstration, and verbal cues required     HOME EXERCISE PROGRAM: Access Code: [email or web address] URL: [email or web address] Date: 08/13/2021 Prepared by: Mikey Kirschner   Exercises Ane Payment, PTA 08/19/21 3:26 PM  Calf stretch Soleus  Stretch Heel raises Bil   ASSESSMENT:   CLINICAL IMPRESSION: Pt arrives for follow up after eval unchanged in pain. He was able to walk this AM with his wife and not take an Advil at lunch. He is independent and compliant with initial HEP. Pt's HEP in [employer] was incorrect so time was spent taking care of this. Ionto was also started.     OBJECTIVE IMPAIRMENTS Abnormal gait, decreased knowledge of condition, difficulty walking, decreased ROM, decreased strength, impaired flexibility, postural dysfunction, and pain.    ACTIVITY LIMITATIONS carrying, lifting, bending, standing,  squatting, sleeping, and stairs   PARTICIPATION LIMITATIONS: cleaning, laundry, driving, shopping, community activity, and yard work   PERSONAL FACTORS Age and Fitness are also affecting patient's functional outcome.    REHAB POTENTIAL: Good   CLINICAL DECISION MAKING: Stable/uncomplicated   EVALUATION COMPLEXITY: Low     GOALS: Goals reviewed with patient? Yes   SHORT TERM GOALS: Target date: 09/10/2021  Pain report to be no greater than 4/10  Baseline: Goal status: INITIAL   2.  Patient will be independent with initial HEP  Baseline:  Goal status: Goal met 08/19/21   LONG TERM GOALS: Target date: 10/08/2021    Patient to report pain no greater than 2/10  Baseline:  Goal status: INITIAL   2.  Patient to be independent with advanced HEP  Baseline:  Goal status: INITIAL   3.  Patient to be able to walk 3 miles without need to stop due to heel pain Baseline:  Goal status: INITIAL   4.  Patient to be able to demonstrate normal, non antalgic gait  Baseline:  Goal status: INITIAL   5.  Patient to be able to discontinue anti-inflammatories Baseline:  Goal status: INITIAL       PLAN: PT FREQUENCY: 1-2x/week   PT DURATION: 8 weeks   PLANNED INTERVENTIONS: Therapeutic exercises, Therapeutic activity, Neuromuscular re-education, Balance training, Gait training, Patient/Family education, Self Care, Joint mobilization, Stair training, Aquatic Therapy, Dry Needling, Electrical stimulation, Cryotherapy, Moist heat, Splintting, Taping, Vasopneumatic device, Ultrasound, Ionotophoresis 4mg /ml Dexamethasone, Manual therapy, and Re-evaluation   PLAN FOR NEXT SESSION: Administer FOTO out of time, see how Ionto went and administer #2. Gentle eccentric strength of ankle.     Lilli Dewald, PTA 08/19/2021, 3:26 PM

## 2021-08-20 ENCOUNTER — Ambulatory Visit: Payer: Medicare Other

## 2021-08-28 ENCOUNTER — Ambulatory Visit: Payer: Medicare Other

## 2021-08-29 ENCOUNTER — Ambulatory Visit: Payer: Medicare Other

## 2021-08-29 DIAGNOSIS — R262 Difficulty in walking, not elsewhere classified: Secondary | ICD-10-CM | POA: Diagnosis not present

## 2021-08-29 DIAGNOSIS — R293 Abnormal posture: Secondary | ICD-10-CM | POA: Diagnosis not present

## 2021-08-29 DIAGNOSIS — M6281 Muscle weakness (generalized): Secondary | ICD-10-CM | POA: Diagnosis not present

## 2021-08-29 DIAGNOSIS — M25571 Pain in right ankle and joints of right foot: Secondary | ICD-10-CM

## 2021-08-29 NOTE — Therapy (Signed)
OUTPATIENT PHYSICAL THERAPY TREATMENT NOTE   Patient Name: Donald Montgomery MRN: [id number] DOB:1945/04/24, 76 y.o., male Today's Date: 08/29/2021  PCP:  Deeann Saint, MD REFERRING PROVIDER: Bjorn Pippin, MD   END OF SESSION:   PT End of Session - 08/29/21 1021     Visit Number 3    Date for PT Re-Evaluation 10/08/21    Authorization Type MEDICARE PART A AND B    PT Start Time 1021    PT Stop Time 1105    PT Time Calculation (min) 44 min    Activity Tolerance Patient tolerated treatment well    Behavior During Therapy [facility] for tasks assessed/performed             Past Medical History:  Diagnosis Date   Cancer [facility])    Past Surgical History:  Procedure Laterality Date   TONSILLECTOMY     Patient Active Problem List   Diagnosis Date Noted   SBO (small bowel obstruction) (HCC) 02/04/2019   Chronic throat clearing 04/14/2016   Chronic rhinitis 04/14/2016   Mild acid reflux 04/14/2016   Inguinal hernia, left 06/22/2013   Testosterone deficiency 04/29/2013   Erectile dysfunction 04/29/2013    REFERRING DIAG:  M76.60 (ICD-10-CM) - Achilles tendonitis     THERAPY DIAG:  Pain in right ankle and joints of right foot  Difficulty in walking, not elsewhere classified  Abnormal posture  Muscle weakness (generalized)  Rationale for Evaluation and Treatment Rehabilitation  PERTINENT HISTORY: Observable "pump bump" right heel.   PRECAUTIONS: None  SUBJECTIVE: "Don't really see any improvement but also had to move all of that furniture [place] this past week so I had to do a lot of up and down steps carrying heavy objects"  PAIN:  Are you having pain? Yes: NPRS scale: 3/10 Pain location: right achilles Pain description: stiff aching Aggravating factors: stairs, inclines, excessive walking Relieving factors: meds, ice   OBJECTIVE: (objective measures completed at initial evaluation unless otherwise dated)   DIAGNOSTIC FINDINGS: none    PATIENT SURVEYS:  FOTO not entered, will enter and administer next visit   COGNITION:           Overall cognitive status: Within functional limits for tasks assessed                          SENSATION: WFL   EDEMA:  Patient has mild edema right ankle but non tender at any of the lateral or medial structures.       POSTURE:  bilateral foot pronation and decreased arch   PALPATION: Tender at insertion of right achilles   LOWER EXTREMITY ROM:   Right ankle DF 20 degrees, PF:  WNL   LOWER EXTREMITY MMT:   All right LE musculature generally 4+ to 5/5   LOWER EXTREMITY SPECIAL TESTS:  Ankle special tests: Homan's test: negative   FUNCTIONAL TESTS:  5 times sit to stand: administer next visit: time constraints Timed up and go (TUG): administer next visit ; time constraints   GAIT: Distance walked: 50 Assistive device utilized: None Level of assistance: Complete Independence Comments: antalgic       TODAY'S TREATMENT: 08/29/21 Recumbent bike  Rocker board x 2 min Prostretch x 10 hold 10 sec each right  Eccentric heel raises x10 4 way ankle with red tband x 20 each  Step up and hold x 20 on balance pad fwd then lateral  SLS x 3 hold  10 sec (floor) Cone touches 3 x 10 right (floor) 3 D ball toss (red plyo ball) on toss back/ trampoline x 20 each dir right (floor) Instructed in cross friction massage for achilles and "pump bump" Ionto #2 to Rt achilles 1 ml of dexamethasone 6 hr wear. Skin intact   TODAY'S TREATMENT: 08/19/21 Review of initial HEP: pt correctly performed all exercises Manual; Gastoc soft tissue work. Pt prone. Tissue supple with no TP Ionto to Rt achilles: 1 ml of dexamethasone 6 hr wear. Skin intact   Initiated HEP     PATIENT EDUCATION:  Education details: Instructed in cross friction massage and its purpose along with use of ice to control inflammation Person educated: Patient Education method: Explanation, Demonstration, Verbal cues, and  Handouts Education comprehension: verbalized understanding, returned demonstration, and verbal cues required     HOME EXERCISE PROGRAM: Access Code: [email or web address] URL: [email or web address] Date: 08/13/2021 Prepared by: Mikey Kirschner   Exercises Ane Payment, PTA 08/29/21 11:17 AM  Calf stretch Soleus Stretch Heel raises Bil   ASSESSMENT:   CLINICAL IMPRESSION: Pt not responding significantly after first 2 visits but understands his condition is slow to respond.  He was able to do all activities today with no c/o pain with exception of cross friction which was "uncomfortable".  Recommended to use ice when he feels soreness or pain.  He would benefit from continued skilled PT for ankle stability and achilles stretching along with STM to reduce scar tissue and adhesions.       OBJECTIVE IMPAIRMENTS Abnormal gait, decreased knowledge of condition, difficulty walking, decreased ROM, decreased strength, impaired flexibility, postural dysfunction, and pain.    ACTIVITY LIMITATIONS carrying, lifting, bending, standing, squatting, sleeping, and stairs   PARTICIPATION LIMITATIONS: cleaning, laundry, driving, shopping, community activity, and yard work   PERSONAL FACTORS Age and Fitness are also affecting patient's functional outcome.    REHAB POTENTIAL: Good   CLINICAL DECISION MAKING: Stable/uncomplicated   EVALUATION COMPLEXITY: Low     GOALS: Goals reviewed with patient? Yes   SHORT TERM GOALS: Target date: 09/10/2021  Pain report to be no greater than 4/10  Baseline: Goal status: INITIAL   2.  Patient will be independent with initial HEP  Baseline:  Goal status: Goal met 08/19/21   LONG TERM GOALS: Target date: 10/08/2021    Patient to report pain no greater than 2/10  Baseline:  Goal status: INITIAL   2.  Patient to be independent with advanced HEP  Baseline:  Goal status: INITIAL   3.  Patient to be able to walk 3 miles without need to stop due to  heel pain Baseline:  Goal status: INITIAL   4.  Patient to be able to demonstrate normal, non antalgic gait  Baseline:  Goal status: INITIAL   5.  Patient to be able to discontinue anti-inflammatories Baseline:  Goal status: INITIAL       PLAN: PT FREQUENCY: 1-2x/week   PT DURATION: 8 weeks   PLANNED INTERVENTIONS: Therapeutic exercises, Therapeutic activity, Neuromuscular re-education, Balance training, Gait training, Patient/Family education, Self Care, Joint mobilization, Stair training, Aquatic Therapy, Dry Needling, Electrical stimulation, Cryotherapy, Moist heat, Splintting, Taping, Vasopneumatic device, Ultrasound, Ionotophoresis 4mg /ml Dexamethasone, Manual therapy, and Re-evaluation   PLAN FOR NEXT SESSION: Administer FOTO out of time, see how Ionto went and administer #2. Gentle eccentric strength of ankle.     Victorino Dike B. Jaimes Eckert, PT 08/29/21 11:17 AM  [facility] [address], [address] [place], [place] [address] Phone # [phone]  Fax [phone]

## 2021-09-02 DIAGNOSIS — Z23 Encounter for immunization: Secondary | ICD-10-CM | POA: Diagnosis not present

## 2021-09-04 ENCOUNTER — Ambulatory Visit: Payer: Medicare Other

## 2021-09-04 DIAGNOSIS — M25571 Pain in right ankle and joints of right foot: Secondary | ICD-10-CM

## 2021-09-04 DIAGNOSIS — M6281 Muscle weakness (generalized): Secondary | ICD-10-CM

## 2021-09-04 DIAGNOSIS — R293 Abnormal posture: Secondary | ICD-10-CM | POA: Diagnosis not present

## 2021-09-04 DIAGNOSIS — R262 Difficulty in walking, not elsewhere classified: Secondary | ICD-10-CM | POA: Diagnosis not present

## 2021-09-04 NOTE — Therapy (Signed)
OUTPATIENT PHYSICAL THERAPY TREATMENT NOTE   Patient Name: Donald Montgomery MRN: [id number] DOB:11/18/1945, 76 y.o., male Today's Date: 09/04/2021  PCP:  Deeann Saint, MD REFERRING PROVIDER: Bjorn Pippin, MD   END OF SESSION:   PT End of Session - 09/04/21 1643     Visit Number 4    Date for PT Re-Evaluation 10/08/21    Authorization Type MEDICARE PART A AND B    PT Start Time 1530    PT Stop Time 1610    PT Time Calculation (min) 40 min    Activity Tolerance Patient tolerated treatment well    Behavior During Therapy [facility] for tasks assessed/performed             Past Medical History:  Diagnosis Date   Cancer [facility])    Past Surgical History:  Procedure Laterality Date   TONSILLECTOMY     Patient Active Problem List   Diagnosis Date Noted   SBO (small bowel obstruction) (HCC) 02/04/2019   Chronic throat clearing 04/14/2016   Chronic rhinitis 04/14/2016   Mild acid reflux 04/14/2016   Inguinal hernia, left 06/22/2013   Testosterone deficiency 04/29/2013   Erectile dysfunction 04/29/2013    REFERRING DIAG:  M76.60 (ICD-10-CM) - Achilles tendonitis     THERAPY DIAG:  Pain in right ankle and joints of right foot  Difficulty in walking, not elsewhere classified  Abnormal posture  Muscle weakness (generalized)  Rationale for Evaluation and Treatment Rehabilitation  PERTINENT HISTORY: Observable "pump bump" right heel.   PRECAUTIONS: None  SUBJECTIVE: "Don't really see any improvement"  PAIN:  Are you having pain? Yes: NPRS scale: 3/10 Pain location: right achilles Pain description: stiff aching Aggravating factors: stairs, inclines, excessive walking Relieving factors: meds, ice   OBJECTIVE: (objective measures completed at initial evaluation unless otherwise dated)   DIAGNOSTIC FINDINGS: none   PATIENT SURVEYS:  FOTO not entered, will enter and administer next visit   COGNITION:           Overall cognitive status: Within functional  limits for tasks assessed                          SENSATION: WFL   EDEMA:  Patient has mild edema right ankle but non tender at any of the lateral or medial structures.       POSTURE:  bilateral foot pronation and decreased arch   PALPATION: Tender at insertion of right achilles   LOWER EXTREMITY ROM:   Right ankle DF 20 degrees, PF:  WNL   LOWER EXTREMITY MMT:   All right LE musculature generally 4+ to 5/5   LOWER EXTREMITY SPECIAL TESTS:  Ankle special tests: Homan's test: negative   FUNCTIONAL TESTS:  5 times sit to stand: administer next visit: time constraints Timed up and go (TUG): administer next visit ; time constraints   GAIT: Distance walked: 50 Assistive device utilized: None Level of assistance: Complete Independence Comments: antalgic       TODAY'S TREATMENT: 09/04/21 Educated patient on anatomy of the foot, plantar fascia, achilles, gastroc soleus, origin and possible reasons for Haglunds deformity.   Reviewed HEP and cross friction massage Discussed proper footwear and orthotics that sometimes help with his condition Applied ionto #3  TODAY'S TREATMENT: 08/29/21 Recumbent bike  Rocker board x 2 min Prostretch x 10 hold 10 sec each right  Eccentric heel raises x10 4 way ankle with red tband x 20 each  Step up and hold  x 20 on balance pad fwd then lateral  SLS x 3 hold 10 sec (floor) Cone touches 3 x 10 right (floor) 3 D ball toss (red plyo ball) on toss back/ trampoline x 20 each dir right (floor) Instructed in cross friction massage for achilles and "pump bump" Ionto #2 to Rt achilles 1 ml of dexamethasone 6 hr wear. Skin intact   TODAY'S TREATMENT: 08/19/21 Review of initial HEP: pt correctly performed all exercises Manual; Gastoc soft tissue work. Pt prone. Tissue supple with no TP Ionto to Rt achilles: 1 ml of dexamethasone 6 hr wear. Skin intact   Initiated HEP     PATIENT EDUCATION:  Education details: Instructed in cross friction  massage and its purpose along with use of ice to control inflammation Person educated: Patient Education method: Explanation, Demonstration, Verbal cues, and Handouts Education comprehension: verbalized understanding, returned demonstration, and verbal cues required     HOME EXERCISE PROGRAM: Access Code: [email or web address] URL: [email or web address] Date: 08/13/2021 Prepared by: Mikey Kirschner   Exercises Ane Payment, PTA 09/04/21 4:57 PM  Calf stretch Soleus Stretch Heel raises Bil   ASSESSMENT:   CLINICAL IMPRESSION: Pt still not responding significantly and is unsure that the therapy is helping.   We discussed doing todays visit and applying his 3rd ionto patch and preparing him to continue independently and see MD for f/u if ionto #3 and consistency with HEP is not successful.     OBJECTIVE IMPAIRMENTS Abnormal gait, decreased knowledge of condition, difficulty walking, decreased ROM, decreased strength, impaired flexibility, postural dysfunction, and pain.    ACTIVITY LIMITATIONS carrying, lifting, bending, standing, squatting, sleeping, and stairs   PARTICIPATION LIMITATIONS: cleaning, laundry, driving, shopping, community activity, and yard work   PERSONAL FACTORS Age and Fitness are also affecting patient's functional outcome.    REHAB POTENTIAL: Good   CLINICAL DECISION MAKING: Stable/uncomplicated   EVALUATION COMPLEXITY: Low     GOALS: Goals reviewed with patient? Yes   SHORT TERM GOALS: Target date: 09/10/2021  Pain report to be no greater than 4/10  Baseline: Goal status: INITIAL   2.  Patient will be independent with initial HEP  Baseline:  Goal status: Goal met 08/19/21   LONG TERM GOALS: Target date: 10/08/2021    Patient to report pain no greater than 2/10  Baseline:  Goal status: INITIAL   2.  Patient to be independent with advanced HEP  Baseline:  Goal status: INITIAL   3.  Patient to be able to walk 3 miles without need to stop  due to heel pain Baseline:  Goal status: INITIAL   4.  Patient to be able to demonstrate normal, non antalgic gait  Baseline:  Goal status: INITIAL   5.  Patient to be able to discontinue anti-inflammatories Baseline:  Goal status: INITIAL       PLAN: PT FREQUENCY: 1-2x/week   PT DURATION: 8 weeks   PLANNED INTERVENTIONS: Therapeutic exercises, Therapeutic activity, Neuromuscular re-education, Balance training, Gait training, Patient/Family education, Self Care, Joint mobilization, Stair training, Aquatic Therapy, Dry Needling, Electrical stimulation, Cryotherapy, Moist heat, Splintting, Taping, Vasopneumatic device, Ultrasound, Ionotophoresis 4mg /ml Dexamethasone, Manual therapy, and Re-evaluation   PLAN FOR NEXT SESSION: Administer FOTO out of time, see how Ionto went and administer #2. Gentle eccentric strength of ankle.     Victorino Dike B. Zimir Kittleson, PT 09/04/21 4:57 PM  [facility] [address], [address] [place], [place] [address] Phone # [phone] Fax [phone]

## 2021-09-11 ENCOUNTER — Ambulatory Visit: Payer: Medicare Other | Attending: Orthopaedic Surgery

## 2021-09-11 DIAGNOSIS — R262 Difficulty in walking, not elsewhere classified: Secondary | ICD-10-CM | POA: Insufficient documentation

## 2021-09-11 DIAGNOSIS — M25571 Pain in right ankle and joints of right foot: Secondary | ICD-10-CM | POA: Insufficient documentation

## 2021-09-11 DIAGNOSIS — R293 Abnormal posture: Secondary | ICD-10-CM | POA: Insufficient documentation

## 2021-09-11 DIAGNOSIS — M6281 Muscle weakness (generalized): Secondary | ICD-10-CM | POA: Diagnosis not present

## 2021-09-11 NOTE — Therapy (Signed)
OUTPATIENT PHYSICAL THERAPY TREATMENT NOTE   Patient Name: Donald Montgomery MRN: [id number] DOB:25-Apr-1945, 76 y.o., male Today's Date: 09/11/2021  PCP:  Deeann Saint, MD REFERRING PROVIDER: Bjorn Pippin, MD   END OF SESSION:   PT End of Session - 09/11/21 1542     Visit Number 5    Date for PT Re-Evaluation 10/08/21    Authorization Type MEDICARE PART A AND B    PT Start Time 1539    PT Stop Time 1602    PT Time Calculation (min) 23 min    Activity Tolerance Patient tolerated treatment well    Behavior During Therapy [facility] for tasks assessed/performed             Past Medical History:  Diagnosis Date   Cancer [facility])    Past Surgical History:  Procedure Laterality Date   TONSILLECTOMY     Patient Active Problem List   Diagnosis Date Noted   SBO (small bowel obstruction) (HCC) 02/04/2019   Chronic throat clearing 04/14/2016   Chronic rhinitis 04/14/2016   Mild acid reflux 04/14/2016   Inguinal hernia, left 06/22/2013   Testosterone deficiency 04/29/2013   Erectile dysfunction 04/29/2013    REFERRING DIAG:  M76.60 (ICD-10-CM) - Achilles tendonitis     THERAPY DIAG:  Pain in right ankle and joints of right foot  Difficulty in walking, not elsewhere classified  Abnormal posture  Muscle weakness (generalized)  Rationale for Evaluation and Treatment Rehabilitation  PERTINENT HISTORY: Observable "pump bump" right heel.   PRECAUTIONS: None  SUBJECTIVE: Patient states he is actually feeling a little better.  "I think I may have been stretching too aggressively"  Patient states he backed off on the stretches and maybe with the ionto, he is getting some relief.  He inquires as to if he could just come in and do ionto patches for another couple of visits.    PAIN:  Are you having pain? Yes: NPRS scale: 3/10 Pain location: right achilles Pain description: stiff aching Aggravating factors: stairs, inclines, excessive walking Relieving factors: meds,  ice   OBJECTIVE: (objective measures completed at initial evaluation unless otherwise dated)   DIAGNOSTIC FINDINGS: none   PATIENT SURVEYS:  FOTO not entered, will enter and administer next visit   COGNITION:           Overall cognitive status: Within functional limits for tasks assessed                          SENSATION: WFL   EDEMA:  Patient has mild edema right ankle but non tender at any of the lateral or medial structures.       POSTURE:  bilateral foot pronation and decreased arch   PALPATION: Tender at insertion of right achilles   LOWER EXTREMITY ROM:   Right ankle DF 20 degrees, PF:  WNL   LOWER EXTREMITY MMT:   All right LE musculature generally 4+ to 5/5   LOWER EXTREMITY SPECIAL TESTS:  Ankle special tests: Homan's test: negative   FUNCTIONAL TESTS:  5 times sit to stand: administer next visit: time constraints Timed up and go (TUG): administer next visit ; time constraints   GAIT: Distance walked: 50 Assistive device utilized: None Level of assistance: Complete Independence Comments: antalgic       TODAY'S TREATMENT: 09/11/21 Reviewed and updated HEP Ionto #4 applied - patient instructed to remove in 6 hours  TODAY'S TREATMENT: 09/04/21 Educated patient on anatomy of the  foot, plantar fascia, achilles, gastroc soleus, origin and possible reasons for Haglunds deformity.   Reviewed HEP and cross friction massage Discussed proper footwear and orthotics that sometimes help with his condition Applied ionto #3  TODAY'S TREATMENT: 08/29/21 Recumbent bike  Rocker board x 2 min Prostretch x 10 hold 10 sec each right  Eccentric heel raises x10 4 way ankle with red tband x 20 each  Step up and hold x 20 on balance pad fwd then lateral  SLS x 3 hold 10 sec (floor) Cone touches 3 x 10 right (floor) 3 D ball toss (red plyo ball) on toss back/ trampoline x 20 each dir right (floor) Instructed in cross friction massage for achilles and "pump bump" Ionto  #2 to Rt achilles 1 ml of dexamethasone 6 hr wear. Skin intact   TODAY'S TREATMENT: 08/19/21 Review of initial HEP: pt correctly performed all exercises Manual; Gastoc soft tissue work. Pt prone. Tissue supple with no TP Ionto to Rt achilles: 1 ml of dexamethasone 6 hr wear. Skin intact   Initiated HEP     PATIENT EDUCATION:  Education details: Instructed in cross friction massage and its purpose along with use of ice to control inflammation Person educated: Patient Education method: Explanation, Demonstration, Verbal cues, and Handouts Education comprehension: verbalized understanding, returned demonstration, and verbal cues required     HOME EXERCISE PROGRAM: Access Code: [email or web address] URL: [email or web address] Date: 08/13/2021 Prepared by: Mikey Kirschner   Exercises Ane Payment, PTA 09/11/21 4:05 PM  Calf stretch Soleus Stretch Heel raises Bil   ASSESSMENT:   CLINICAL IMPRESSION: Patient seems to have responded to last session and some adjustment to his HEP.  He seems to be somewhat inconsistent at times with his HEP and visits here which makes treatment challenging.   We discussed doing ionto patch and updating HEP for the next few visits.  DC if not improving and have patient f/u with MD.     OBJECTIVE IMPAIRMENTS Abnormal gait, decreased knowledge of condition, difficulty walking, decreased ROM, decreased strength, impaired flexibility, postural dysfunction, and pain.    ACTIVITY LIMITATIONS carrying, lifting, bending, standing, squatting, sleeping, and stairs   PARTICIPATION LIMITATIONS: cleaning, laundry, driving, shopping, community activity, and yard work   PERSONAL FACTORS Age and Fitness are also affecting patient's functional outcome.    REHAB POTENTIAL: Good   CLINICAL DECISION MAKING: Stable/uncomplicated   EVALUATION COMPLEXITY: Low     GOALS: Goals reviewed with patient? Yes   SHORT TERM GOALS: Target date: 09/10/2021  Pain report  to be no greater than 4/10  Baseline: Goal status: INITIAL   2.  Patient will be independent with initial HEP  Baseline:  Goal status: Goal met 08/19/21   LONG TERM GOALS: Target date: 10/08/2021    Patient to report pain no greater than 2/10  Baseline:  Goal status: INITIAL   2.  Patient to be independent with advanced HEP  Baseline:  Goal status: INITIAL   3.  Patient to be able to walk 3 miles without need to stop due to heel pain Baseline:  Goal status: INITIAL   4.  Patient to be able to demonstrate normal, non antalgic gait  Baseline:  Goal status: INITIAL   5.  Patient to be able to discontinue anti-inflammatories Baseline:  Goal status: INITIAL       PLAN: PT FREQUENCY: 1-2x/week   PT DURATION: 8 weeks   PLANNED INTERVENTIONS: Therapeutic exercises, Therapeutic activity, Neuromuscular re-education, Balance training, Gait training, Patient/Family education, Self Care, Joint mobilization,  Stair training, Aquatic Therapy, Dry Needling, Electrical stimulation, Cryotherapy, Moist heat, Splintting, Taping, Vasopneumatic device, Ultrasound, Ionotophoresis 4mg /ml Dexamethasone, Manual therapy, and Re-evaluation   PLAN FOR NEXT SESSION: Administer FOTO out of time, see how Ionto went and administer #2. Gentle eccentric strength of ankle.     Victorino Dike B. Arnisha Laffoon, PT 09/11/21 4:16 PM  [facility] [address], [address] [place], [place] [address] Phone # [phone] Fax [phone]

## 2021-09-20 ENCOUNTER — Ambulatory Visit: Payer: Medicare Other

## 2021-09-20 DIAGNOSIS — M6281 Muscle weakness (generalized): Secondary | ICD-10-CM

## 2021-09-20 DIAGNOSIS — R293 Abnormal posture: Secondary | ICD-10-CM

## 2021-09-20 DIAGNOSIS — R262 Difficulty in walking, not elsewhere classified: Secondary | ICD-10-CM

## 2021-09-20 DIAGNOSIS — M25571 Pain in right ankle and joints of right foot: Secondary | ICD-10-CM | POA: Diagnosis not present

## 2021-09-20 NOTE — Therapy (Signed)
OUTPATIENT PHYSICAL THERAPY TREATMENT NOTE   Patient Name: ARTIN BOUCH MRN: [id number] DOB:07-Dec-1945, 76 y.o., male Today's Date: 09/20/2021  PCP:  Deeann Saint, MD REFERRING PROVIDER: Bjorn Pippin, MD   END OF SESSION:   PT End of Session - 09/20/21 0936     Visit Number 6    Date for PT Re-Evaluation 10/08/21    Authorization Type MEDICARE PART A AND B    PT Start Time 0935    PT Stop Time 1005    PT Time Calculation (min) 30 min    Activity Tolerance Patient tolerated treatment well    Behavior During Therapy [facility] for tasks assessed/performed             Past Medical History:  Diagnosis Date   Cancer [facility])    Past Surgical History:  Procedure Laterality Date   TONSILLECTOMY     Patient Active Problem List   Diagnosis Date Noted   SBO (small bowel obstruction) (HCC) 02/04/2019   Chronic throat clearing 04/14/2016   Chronic rhinitis 04/14/2016   Mild acid reflux 04/14/2016   Inguinal hernia, left 06/22/2013   Testosterone deficiency 04/29/2013   Erectile dysfunction 04/29/2013    REFERRING DIAG:  M76.60 (ICD-10-CM) - Achilles tendonitis     THERAPY DIAG:  Pain in right ankle and joints of right foot  Difficulty in walking, not elsewhere classified  Abnormal posture  Muscle weakness (generalized)  Rationale for Evaluation and Treatment Rehabilitation  PERTINENT HISTORY: Observable "pump bump" right heel.   PRECAUTIONS: None  SUBJECTIVE: Patient states he is "the same".     PAIN:  Are you having pain? Yes: NPRS scale: 3/10 Pain location: right achilles Pain description: stiff aching Aggravating factors: stairs, inclines, excessive walking Relieving factors: meds, ice   OBJECTIVE: (objective measures completed at initial evaluation unless otherwise dated)   DIAGNOSTIC FINDINGS: none   PATIENT SURVEYS:  FOTO not entered, will enter and administer next visit   COGNITION:           Overall cognitive status: Within functional  limits for tasks assessed                          SENSATION: WFL   EDEMA:  Patient has mild edema right ankle but non tender at any of the lateral or medial structures.       POSTURE:  bilateral foot pronation and decreased arch   PALPATION: Tender at insertion of right achilles   LOWER EXTREMITY ROM:   Right ankle DF 20 degrees, PF:  WNL   LOWER EXTREMITY MMT:   All right LE musculature generally 4+ to 5/5   LOWER EXTREMITY SPECIAL TESTS:  Ankle special tests: Homan's test: negative   FUNCTIONAL TESTS:  5 times sit to stand: administer next visit: time constraints Timed up and go (TUG): administer next visit ; time constraints   GAIT: Distance walked: 50 Assistive device utilized: None Level of assistance: Complete Independence Comments: antalgic       TODAY'S TREATMENT: 09/19/21 DC assessment Discussed DC plan and how to progress HEP.  Educated patient on treatment options and appropriate time to f/u with MD  TODAY'S TREATMENT: 09/11/21 Reviewed and updated HEP Ionto #4 applied - patient instructed to remove in 6 hours  TODAY'S TREATMENT: 09/04/21 Educated patient on anatomy of the foot, plantar fascia, achilles, gastroc soleus, origin and possible reasons for Haglunds deformity.   Reviewed HEP and cross friction massage Discussed proper footwear  and orthotics that sometimes help with his condition Applied ionto #3   PATIENT EDUCATION:  Education details: Instructed in cross friction massage and its purpose along with use of ice to control inflammation Person educated: Patient Education method: Explanation, Demonstration, Verbal cues, and Handouts Education comprehension: verbalized understanding, returned demonstration, and verbal cues required     HOME EXERCISE PROGRAM: Access Code: [email or web address] URL: [email or web address] Date: 08/13/2021 Prepared by: Mikey Kirschner   Exercises Ane Payment, PTA Calf stretch Soleus Stretch Heel  raises Bil   ASSESSMENT:   CLINICAL IMPRESSION: Patient does not seem to feel he is making progress but is inconsistent with his HEP and with suggested modalities.  We discussed other treatment options but patient does not want to take any pain medication or consider cortisone.  Patient does not feel he is improving and would like to DC at this time.     OBJECTIVE IMPAIRMENTS Abnormal gait, decreased knowledge of condition, difficulty walking, decreased ROM, decreased strength, impaired flexibility, postural dysfunction, and pain.    ACTIVITY LIMITATIONS carrying, lifting, bending, standing, squatting, sleeping, and stairs   PARTICIPATION LIMITATIONS: cleaning, laundry, driving, shopping, community activity, and yard work   PERSONAL FACTORS Age and Fitness are also affecting patient's functional outcome.    REHAB POTENTIAL: Good   CLINICAL DECISION MAKING: Stable/uncomplicated   EVALUATION COMPLEXITY: Low     GOALS: Goals reviewed with patient? Yes   SHORT TERM GOALS: Target date: 09/10/2021  Pain report to be no greater than 4/10  Baseline: Goal status: MET   2.  Patient will be independent with initial HEP  Baseline:  Goal status: Goal met 08/19/21   LONG TERM GOALS: Target date: 10/08/2021    Patient to report pain no greater than 2/10  Baseline:  Goal status: Partially met   2.  Patient to be independent with advanced HEP  Baseline:  Goal status: Partially met   3.  Patient to be able to walk 3 miles without need to stop due to heel pain Baseline:  Goal status: Partially met   4.  Patient to be able to demonstrate normal, non antalgic gait  Baseline:  Goal status: MET   5.  Patient to be able to discontinue anti-inflammatories Baseline:  Goal status: MET       PLAN: PT FREQUENCY: 1-2x/week   PT DURATION: 8 weeks   PLANNED INTERVENTIONS: Therapeutic exercises, Therapeutic activity, Neuromuscular re-education, Balance training, Gait training,  Patient/Family education, Self Care, Joint mobilization, Stair training, Aquatic Therapy, Dry Needling, Electrical stimulation, Cryotherapy, Moist heat, Splintting, Taping, Vasopneumatic device, Ultrasound, Ionotophoresis 4mg /ml Dexamethasone, Manual therapy, and Re-evaluation   PLAN FOR NEXT SESSION: We will DC at this time.     PHYSICAL THERAPY DISCHARGE SUMMARY  Visits from Start of Care: 9  Current functional level related to goals / functional outcomes: See above   Remaining deficits: See above   Education / Equipment: See above   Patient agrees to discharge. Patient goals were partially met. Patient is being discharged due to being pleased with the current functional level.  Victorino Dike B. Mcadoo Muzquiz, PT 09/20/21 11:57 PM  [facility] [address] [place], [place] [address] Phone # [phone] Fax [phone]

## 2021-09-26 ENCOUNTER — Ambulatory Visit: Payer: Medicare Other

## 2021-10-16 DIAGNOSIS — E291 Testicular hypofunction: Secondary | ICD-10-CM | POA: Diagnosis not present

## 2021-10-16 DIAGNOSIS — N400 Enlarged prostate without lower urinary tract symptoms: Secondary | ICD-10-CM | POA: Diagnosis not present

## 2021-10-25 DIAGNOSIS — N5201 Erectile dysfunction due to arterial insufficiency: Secondary | ICD-10-CM | POA: Diagnosis not present

## 2021-10-25 DIAGNOSIS — E291 Testicular hypofunction: Secondary | ICD-10-CM | POA: Diagnosis not present

## 2021-11-07 ENCOUNTER — Telehealth: Payer: Self-pay | Admitting: Family Medicine

## 2021-11-07 NOTE — Telephone Encounter (Signed)
Spoke with patient to  schedule Medicare Annual Wellness Visit (AWV) either virtually or in office.  He stated he was out and would like me back later today    Last AWV  11/19/20 please schedule with Nurse Health Adviser   45 min for awv-i and in office appointments 30 min for awv-s  phone/virtual appointments

## 2021-11-22 ENCOUNTER — Ambulatory Visit (INDEPENDENT_AMBULATORY_CARE_PROVIDER_SITE_OTHER): Payer: Medicare Other

## 2021-11-22 VITALS — Ht 70.8 in | Wt 183.0 lb

## 2021-11-22 DIAGNOSIS — Z0000 Encounter for general adult medical examination without abnormal findings: Secondary | ICD-10-CM

## 2021-11-22 NOTE — Patient Instructions (Addendum)
Donald Montgomery , Thank you for taking time to come for your Medicare Wellness Visit. I appreciate your ongoing commitment to your health goals. Please review the following plan we discussed and let me know if I can assist you in the future.   These are the goals we discussed:  Goals       No current goals (pt-stated)      patient      Continue to walk and stay healthy Continue to use  Your weights x 2 per week       Patient Stated      I will continue to walk 3-4 miles once day      to maintain      Lift weight a couple of days a week         This is a list of the screening recommended for you and due dates:  Health Maintenance  Topic Date Due   COVID-19 Vaccine (6 - Moderna series) 12/08/2021*   Zoster (Shingles) Vaccine (1 of 2) 02/22/2022*   Flu Shot  04/06/2022*   Medicare Annual Wellness Visit  11/23/2022   Pneumonia Vaccine  Completed   Hepatitis C Screening: USPSTF Recommendation to screen - Ages 18-79 yo.  Completed   HPV Vaccine  Aged Out   Colon Cancer Screening  Discontinued  *Topic was postponed. The date shown is not the original due date.    Advanced directives: Please bring a copy of your health care power of attorney and living will to the office to be added to your chart at your convenience.   Conditions/risks identified: None  Next appointment: Follow up in one year for your annual wellness visit.    Preventive Care 28 Years and Older, Male  Preventive care refers to lifestyle choices and visits with your health care provider that can promote health and wellness. What does preventive care include? A yearly physical exam. This is also called an annual well check. Dental exams once or twice a year. Routine eye exams. Ask your health care provider how often you should have your eyes checked. Personal lifestyle choices, including: Daily care of your teeth and gums. Regular physical activity. Eating a healthy diet. Avoiding tobacco and drug  use. Limiting alcohol use. Practicing safe sex. Taking low doses of aspirin every day. Taking vitamin and mineral supplements as recommended by your health care provider. What happens during an annual well check? The services and screenings done by your health care provider during your annual well check will depend on your age, overall health, lifestyle risk factors, and family history of disease. Counseling  Your health care provider may ask you questions about your: Alcohol use. Tobacco use. Drug use. Emotional well-being. Home and relationship well-being. Sexual activity. Eating habits. History of falls. Memory and ability to understand (cognition). Work and work Statistician. Screening  You may have the following tests or measurements: Height, weight, and BMI. Blood pressure. Lipid and cholesterol levels. These may be checked every 5 years, or more frequently if you are over 59 years old. Skin check. Lung cancer screening. You may have this screening every year starting at age 65 if you have a 30-pack-year history of smoking and currently smoke or have quit within the past 15 years. Fecal occult blood test (FOBT) of the stool. You may have this test every year starting at age 47. Flexible sigmoidoscopy or colonoscopy. You may have a sigmoidoscopy every 5 years or a colonoscopy every 10 years starting at age 25. Prostate  cancer screening. Recommendations will vary depending on your family history and other risks. Hepatitis C blood test. Hepatitis B blood test. Sexually transmitted disease (STD) testing. Diabetes screening. This is done by checking your blood sugar (glucose) after you have not eaten for a while (fasting). You may have this done every 1-3 years. Abdominal aortic aneurysm (AAA) screening. You may need this if you are a current or former smoker. Osteoporosis. You may be screened starting at age 69 if you are at high risk. Talk with your health care provider about  your test results, treatment options, and if necessary, the need for more tests. Vaccines  Your health care provider may recommend certain vaccines, such as: Influenza vaccine. This is recommended every year. Tetanus, diphtheria, and acellular pertussis (Tdap, Td) vaccine. You may need a Td booster every 10 years. Zoster vaccine. You may need this after age 51. Pneumococcal 13-valent conjugate (PCV13) vaccine. One dose is recommended after age 90. Pneumococcal polysaccharide (PPSV23) vaccine. One dose is recommended after age 46. Talk to your health care provider about which screenings and vaccines you need and how often you need them. This information is not intended to replace advice given to you by your health care provider. Make sure you discuss any questions you have with your health care provider. Document Released: 01/19/2015 Document Revised: 09/12/2015 Document Reviewed: 10/24/2014 Elsevier Interactive Patient Education  2017 [place] Prevention in the Home Falls can cause injuries. They can happen to people of all ages. There are many things you can do to make your home safe and to help prevent falls. What can I do on the outside of my home? Regularly fix the edges of walkways and driveways and fix any cracks. Remove anything that might make you trip as you walk through a door, such as a raised step or threshold. Trim any bushes or trees on the path to your home. Use bright outdoor lighting. Clear any walking paths of anything that might make someone trip, such as rocks or tools. Regularly check to see if handrails are loose or broken. Make sure that both sides of any steps have handrails. Any raised decks and porches should have guardrails on the edges. Have any leaves, snow, or ice cleared regularly. Use sand or salt on walking paths during winter. Clean up any spills in your garage right away. This includes oil or grease spills. What can I do in the bathroom? Use  night lights. Install grab bars by the toilet and in the tub and shower. Do not use towel bars as grab bars. Use non-skid mats or decals in the tub or shower. If you need to sit down in the shower, use a plastic, non-slip stool. Keep the floor dry. Clean up any water that spills on the floor as soon as it happens. Remove soap buildup in the tub or shower regularly. Attach bath mats securely with double-sided non-slip rug tape. Do not have throw rugs and other things on the floor that can make you trip. What can I do in the bedroom? Use night lights. Make sure that you have a light by your bed that is easy to reach. Do not use any sheets or blankets that are too big for your bed. They should not hang down onto the floor. Have a firm chair that has side arms. You can use this for support while you get dressed. Do not have throw rugs and other things on the floor that can make you trip. What  can I do in the kitchen? Clean up any spills right away. Avoid walking on wet floors. Keep items that you use a lot in easy-to-reach places. If you need to reach something above you, use a strong step stool that has a grab bar. Keep electrical cords out of the way. Do not use floor polish or wax that makes floors slippery. If you must use wax, use non-skid floor wax. Do not have throw rugs and other things on the floor that can make you trip. What can I do with my stairs? Do not leave any items on the stairs. Make sure that there are handrails on both sides of the stairs and use them. Fix handrails that are broken or loose. Make sure that handrails are as long as the stairways. Check any carpeting to make sure that it is firmly attached to the stairs. Fix any carpet that is loose or worn. Avoid having throw rugs at the top or bottom of the stairs. If you do have throw rugs, attach them to the floor with carpet tape. Make sure that you have a light switch at the top of the stairs and the bottom of the  stairs. If you do not have them, ask someone to add them for you. What else can I do to help prevent falls? Wear shoes that: Do not have high heels. Have rubber bottoms. Are comfortable and fit you well. Are closed at the toe. Do not wear sandals. If you use a stepladder: Make sure that it is fully opened. Do not climb a closed stepladder. Make sure that both sides of the stepladder are locked into place. Ask someone to hold it for you, if possible. Clearly mark and make sure that you can see: Any grab bars or handrails. First and last steps. Where the edge of each step is. Use tools that help you move around (mobility aids) if they are needed. These include: Canes. Walkers. Scooters. Crutches. Turn on the lights when you go into a dark area. Replace any light bulbs as soon as they burn out. Set up your furniture so you have a clear path. Avoid moving your furniture around. If any of your floors are uneven, fix them. If there are any pets around you, be aware of where they are. Review your medicines with your doctor. Some medicines can make you feel dizzy. This can increase your chance of falling. Ask your doctor what other things that you can do to help prevent falls. This information is not intended to replace advice given to you by your health care provider. Make sure you discuss any questions you have with your health care provider. Document Released: 10/19/2008 Document Revised: 05/31/2015 Document Reviewed: 01/27/2014 Elsevier Interactive Patient Education  2017 [employer].

## 2021-11-22 NOTE — Progress Notes (Signed)
Subjective:   Donald Montgomery is a 76 y.o. male who presents for Medicare Annual/Subsequent preventive examination.  Review of Systems    Virtual Visit via Telephone Note  I connected with  Anner Crete on 11/22/21 at 11:00 AM EST by telephone and verified that I am speaking with the correct person using two identifiers.  Location: Patient: Home Provider: Office Persons participating in the virtual visit: patient/Nurse Health Advisor   I discussed the limitations, risks, security and privacy concerns of performing an evaluation and management service by telephone and the availability of in person appointments. The patient expressed understanding and agreed to proceed.  Interactive audio and video telecommunications were attempted between this nurse and patient, however failed, due to patient having technical difficulties OR patient did not have access to video capability.  We continued and completed visit with audio only.  Some vital signs may be absent or patient reported.   Criselda Peaches, LPN  Cardiac Risk Factors include: advanced age (>94mn, >>27women)     Objective:    Today's Vitals   11/22/21 1100  Weight: 183 lb (83 kg)  Height: 5' 10.8" (1.798 m)   Body mass index is 25.67 kg/m.     11/22/2021   11:07 AM 08/13/2021    4:30 PM 08/30/2019    3:07 PM 07/01/2017    1:28 PM 03/04/2016   10:38 AM  Advanced Directives  Does Patient Have a Medical Advance Directive? Yes No Yes Yes Yes  Type of [place]amed[place]of [place]  [place]    Does patient want to make changes to medical advance directive?   No - Patient declined    Copy of [place] Chart? No - copy requested  No - copy requested    Would patient like information on creating a medical advance directive?  No - Patient declined       Current Medications (verified) Outpatient Encounter Medications as of 11/22/2021   Medication Sig   ANDROGEL PUMP 20.25 MG/ACT (1.62%) GEL Apply 4 application topically See admin instructions. Apply 2 pumps to each shoulder once a day   b complex vitamins tablet Take 1 tablet by mouth daily with supper.   Black Elderberry (SAMBUCUS ELDERBERRY PO) Take 1 tablet by mouth daily.    Cholecalciferol (VITAMIN D-3) 25 MCG (1000 UT) CAPS Take 2,000 Units by mouth daily with supper.   Coenzyme Q10 (CO Q 10 PO) Take 1 capsule by mouth daily.   ibuprofen (ADVIL) 200 MG tablet Take 200 mg by mouth every 6 (six) hours as needed for headache or mild pain.   L-ARGININE PO Take 1 tablet by mouth daily with lunch.   MAGNESIUM PO Take 1 tablet by mouth daily with supper.   Omega-3 Fatty Acids (FISH OIL) 1000 MG CAPS Take 1,000 mg by mouth daily with supper.    polyethylene glycol (MIRALAX) 17 g packet Take 17 g by mouth daily as needed for moderate constipation.   Probiotic Product (ALIGN) 4 MG CAPS Take 4 mg by mouth daily.   tadalafil (CIALIS) 5 MG tablet Take 10-15 mg by mouth daily as needed for erectile dysfunction.    No facility-administered encounter medications on file as of 11/22/2021.    Allergies (verified) Patient has no known allergies.   History: Past Medical History:  Diagnosis Date   Cancer ([facility]    Past Surgical History:  Procedure Laterality Date   TONSILLECTOMY  Family History  Problem Relation Age of Onset   Heart disease Mother    Stroke Brother    Heart disease Brother    Allergic rhinitis Neg Hx    Angioedema Neg Hx    Asthma Neg Hx    Eczema Neg Hx    Immunodeficiency Neg Hx    Urticaria Neg Hx    Social History   Socioeconomic History   Marital status: Married    Spouse name: Not on file   Number of children: Not on file   Years of education: Not on file   Highest education level: Not on file  Occupational History   Not on file  Tobacco Use   Smoking status: Former    Packs/day: 18.00    Years: 1.00    Total pack years: 18.00     Types: Cigarettes, Pipe   Smokeless tobacco: Never   Tobacco comments:    will consider AAA check as he states he smoked over 100 cigerattes   Substance and Sexual Activity   Alcohol use: No   Drug use: No   Sexual activity: Not on file  Other Topics Concern   Not on file  Social History Narrative   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    Spends time with [name] dtr    Social Determinants of Health   Financial Resource Strain: Low Risk  (11/22/2021)   Overall Financial Resource Strain (CARDIA)    Difficulty of Paying Living Expenses: Not hard at all  Food Insecurity: No Food Insecurity (11/22/2021)   Hunger Vital Sign    Worried About Running Out of Food in the Last Year: Never true    Ran Out of Food in the Last Year: Never true  Transportation Needs: No Transportation Needs (11/22/2021)   PRAPARE - Hydrologist (Medical): No    Lack of Transportation (Non-Medical): No  Physical Activity: Sufficiently Active (11/22/2021)   Exercise Vital Sign    Days of Exercise per Week: 7 days    Minutes of Exercise per Session: 60 min  Stress: No Stress Concern Present (11/22/2021)   [place]    Feeling of Stress : Not at all  Social Connections: [place] (11/22/2021)   Social Connection and Isolation Panel [NHANES]    Frequency of Communication with Friends and Family: More than three times a week    Frequency of Social Gatherings with Friends and Family: More than three times a week    Attends Religious Services: More than 4 times per year    Active Member of [employer] or Organizations: Yes    Attends Music therapist: More than 4 times per year    Marital Status: Married    Tobacco Counseling Counseling given: Not Answered Tobacco comments: will consider AAA check as he states he smoked over 100 cigerattes    Clinical Intake:  Pre-visit  preparation completed: No  Pain : No/denies pain     BMI - recorded: 25.67 Nutritional Status: BMI 25 -29 Overweight Nutritional Risks: None Diabetes: No  How often do you need to have someone help you when you read instructions, pamphlets, or other written materials from your doctor or pharmacy?: 1 - Never  Diabetic?  No  Interpreter Needed?: No  Information entered by :: Rolene Arbour LPN   Activities of Daily Living    11/22/2021   11:06 AM  In your present  state of health, do you have any difficulty performing the following activities:  Hearing? 0  Vision? 0  Difficulty concentrating or making decisions? 0  Walking or climbing stairs? 0  Dressing or bathing? 0  Doing errands, shopping? 0  Preparing Food and eating ? N  Using the Toilet? N  In the past six months, have you accidently leaked urine? N  Do you have problems with loss of bowel control? N  Managing your Medications? N  Managing your Finances? N  Housekeeping or managing your Housekeeping? N    Patient Care Team: Billie Ruddy, MD as PCP - General (Family Medicine) Irine Seal, MD as Attending Physician (Urology)  Indicate any recent Medical Services you may have received from other than [facility] providers in the past year (date may be approximate).     Assessment:   This is a routine wellness examination for Aidyn.  Hearing/Vision screen Hearing Screening - Comments:: Denies hearing difficulties   Vision Screening - Comments:: Wears rx glasses - up to date with routine eye exams with  Dr Modesto Charon  Dietary issues and exercise activities discussed: Current Exercise Habits: Home exercise routine, Type of exercise: walking, Time (Minutes): 60, Frequency (Times/Week): 5, Weekly Exercise (Minutes/Week): 300, Intensity: Moderate, Exercise limited by: None identified   Goals Addressed               This Visit's Progress     No current goals (pt-stated)         Depression Screen     11/22/2021   11:05 AM 11/19/2020   11:13 AM 08/30/2019    3:18 PM 07/01/2017    1:35 PM 03/04/2016   10:41 AM 02/26/2015    9:36 AM  PHQ 2/9 Scores  PHQ - 2 Score 0 0 0 0 0 0  PHQ- 9 Score  0 0       Fall Risk    11/22/2021   11:07 AM 08/30/2019    3:14 PM 07/01/2017    1:35 PM 03/04/2016   10:41 AM 02/26/2015    9:36 AM  [place] in the past year? 0 0 No No No  Number falls in past yr: 0 0     Injury with Fall? 0 0     Risk for fall due to : No Fall Risks Medication side effect     Follow up Falls prevention discussed Falls evaluation completed;Falls prevention discussed       FALL RISK PREVENTION PERTAINING TO THE HOME:  Any stairs in or around the home? Yes  If so, are there any without handrails? No  Home free of loose throw rugs in walkways, pet beds, electrical cords, etc? No  Adequate lighting in your home to reduce risk of falls? No   ASSISTIVE DEVICES UTILIZED TO PREVENT FALLS:  Life alert? No  Use of a cane, walker or w/c? No  Grab bars in the bathroom? No  Shower chair or bench in shower? Yes  Elevated toilet seat or a handicapped toilet? No   TIMED UP AND GO:  Was the test performed? No . Audio Visit    Cognitive Function:        11/22/2021   11:07 AM 08/30/2019    3:21 PM 07/01/2017    1:35 PM  6CIT Screen  What Year? 0 points 0 points 0 points  What month? 0 points 0 points 0 points  What time? 0 points 0 points 0 points  Count back from 20  0 points 0 points 0 points  Months in reverse 0 points 0 points 0 points  Repeat phrase 0 points 0 points 0 points  Total Score 0 points 0 points 0 points    Immunizations Immunization History  Administered Date(s) Administered   Fluad Quad(high Dose 65+) 09/06/2018   Influenza Whole 10/10/2009   Influenza, High Dose Seasonal PF 10/13/2013, 11/28/2014, 10/10/2015, 09/09/2016, 10/19/2017   Influenza-Unspecified 10/13/2013, 11/28/2014, 10/10/2015, 09/09/2016, 10/19/2017   Moderna Sars-Covid-2  Vaccination 02/01/2019, 03/01/2019, 11/02/2019, 04/19/2020, 11/03/2020   Pneumococcal Conjugate-13 02/26/2015   Pneumococcal Polysaccharide-23 12/01/2011   Tdap 01/09/2012   Zoster, Live 01/09/2012      Flu Vaccine status: Up to date  Pneumococcal vaccine status: Up to date  Covid-19 vaccine status: Completed vaccines  Qualifies for Shingles Vaccine? Yes   Zostavax completed No   Shingrix Completed?: No.    Education has been provided regarding the importance of this vaccine. Patient has been advised to call insurance company to determine out of pocket expense if they have not yet received this vaccine. Advised may also receive vaccine at local pharmacy or Health Dept. Verbalized acceptance and understanding.  Screening Tests Health Maintenance  Topic Date Due   COVID-19 Vaccine (6 - Moderna series) 12/08/2021 (Originally 12/29/2020)   Zoster Vaccines- Shingrix (1 of 2) 02/22/2022 (Originally 06/26/1994)   INFLUENZA VACCINE  04/06/2022 (Originally 08/06/2021)   Medicare Annual Wellness (AWV)  11/23/2022   Pneumonia Vaccine 21+ Years old  Completed   Hepatitis C Screening  Completed   HPV VACCINES  Aged Out   COLONOSCOPY (Pts 45-83yr Insurance coverage will need to be confirmed)  Discontinued    Health Maintenance  There are no preventive care reminders to display for this patient.   Colorectal cancer screening: No longer required.   Lung Cancer Screening: (Low Dose CT Chest recommended if Age 77-80years, 30 pack-year currently smoking OR have quit w/in 15years.) does not qualify.     Additional Screening:  Hepatitis C Screening: does qualify; Completed 02/26/15  Vision Screening: Recommended annual ophthalmology exams for early detection of glaucoma and other disorders of the eye. Is the patient up to date with their annual eye exam?  Yes  Who is the provider or what is the name of the office in which the patient attends annual eye exams? Dr FModesto CharonIf pt is not  established with a provider, would they like to be referred to a provider to establish care? No .   Dental Screening: Recommended annual dental exams for proper oral hygiene  Community Resource Referral / Chronic Care Management:  CRR required this visit?  No   CCM required this visit?  No      Plan:     I have personally reviewed and noted the following in the patient's chart:   Medical and social history Use of alcohol, tobacco or illicit drugs  Current medications and supplements including opioid prescriptions. Patient is not currently taking opioid prescriptions. Functional ability and status Nutritional status Physical activity Advanced directives List of other physicians Hospitalizations, surgeries, and ER visits in previous 12 months Vitals Screenings to include cognitive, depression, and falls Referrals and appointments  In addition, I have reviewed and discussed with patient certain preventive protocols, quality metrics, and best practice recommendations. A written personalized care plan for preventive services as well as general preventive health recommendations were provided to patient.     BCriselda Peaches LPN   [id number]  Nurse Notes: None

## 2021-12-17 DIAGNOSIS — L821 Other seborrheic keratosis: Secondary | ICD-10-CM | POA: Diagnosis not present

## 2021-12-17 DIAGNOSIS — L814 Other melanin hyperpigmentation: Secondary | ICD-10-CM | POA: Diagnosis not present

## 2021-12-17 DIAGNOSIS — L309 Dermatitis, unspecified: Secondary | ICD-10-CM | POA: Diagnosis not present

## 2021-12-17 DIAGNOSIS — L570 Actinic keratosis: Secondary | ICD-10-CM | POA: Diagnosis not present

## 2021-12-17 DIAGNOSIS — Z85828 Personal history of other malignant neoplasm of skin: Secondary | ICD-10-CM | POA: Diagnosis not present

## 2021-12-17 DIAGNOSIS — L578 Other skin changes due to chronic exposure to nonionizing radiation: Secondary | ICD-10-CM | POA: Diagnosis not present

## 2021-12-17 DIAGNOSIS — D225 Melanocytic nevi of trunk: Secondary | ICD-10-CM | POA: Diagnosis not present

## 2021-12-17 DIAGNOSIS — Z86018 Personal history of other benign neoplasm: Secondary | ICD-10-CM | POA: Diagnosis not present

## 2021-12-17 DIAGNOSIS — D2271 Melanocytic nevi of right lower limb, including hip: Secondary | ICD-10-CM | POA: Diagnosis not present

## 2022-01-08 DIAGNOSIS — Z23 Encounter for immunization: Secondary | ICD-10-CM | POA: Diagnosis not present

## 2022-01-12 IMAGING — DX DG ABD PORTABLE 2V
3 series · 3 of 3 positions shown · non-contrast
Comparison: CT abdomen 02/04/2019

CLINICAL DATA: Epigastric pain.  Evaluate bowel obstruction.

EXAM:
PORTABLE ABDOMEN - 2 VIEW

[abdomen kub (1 of 3)]
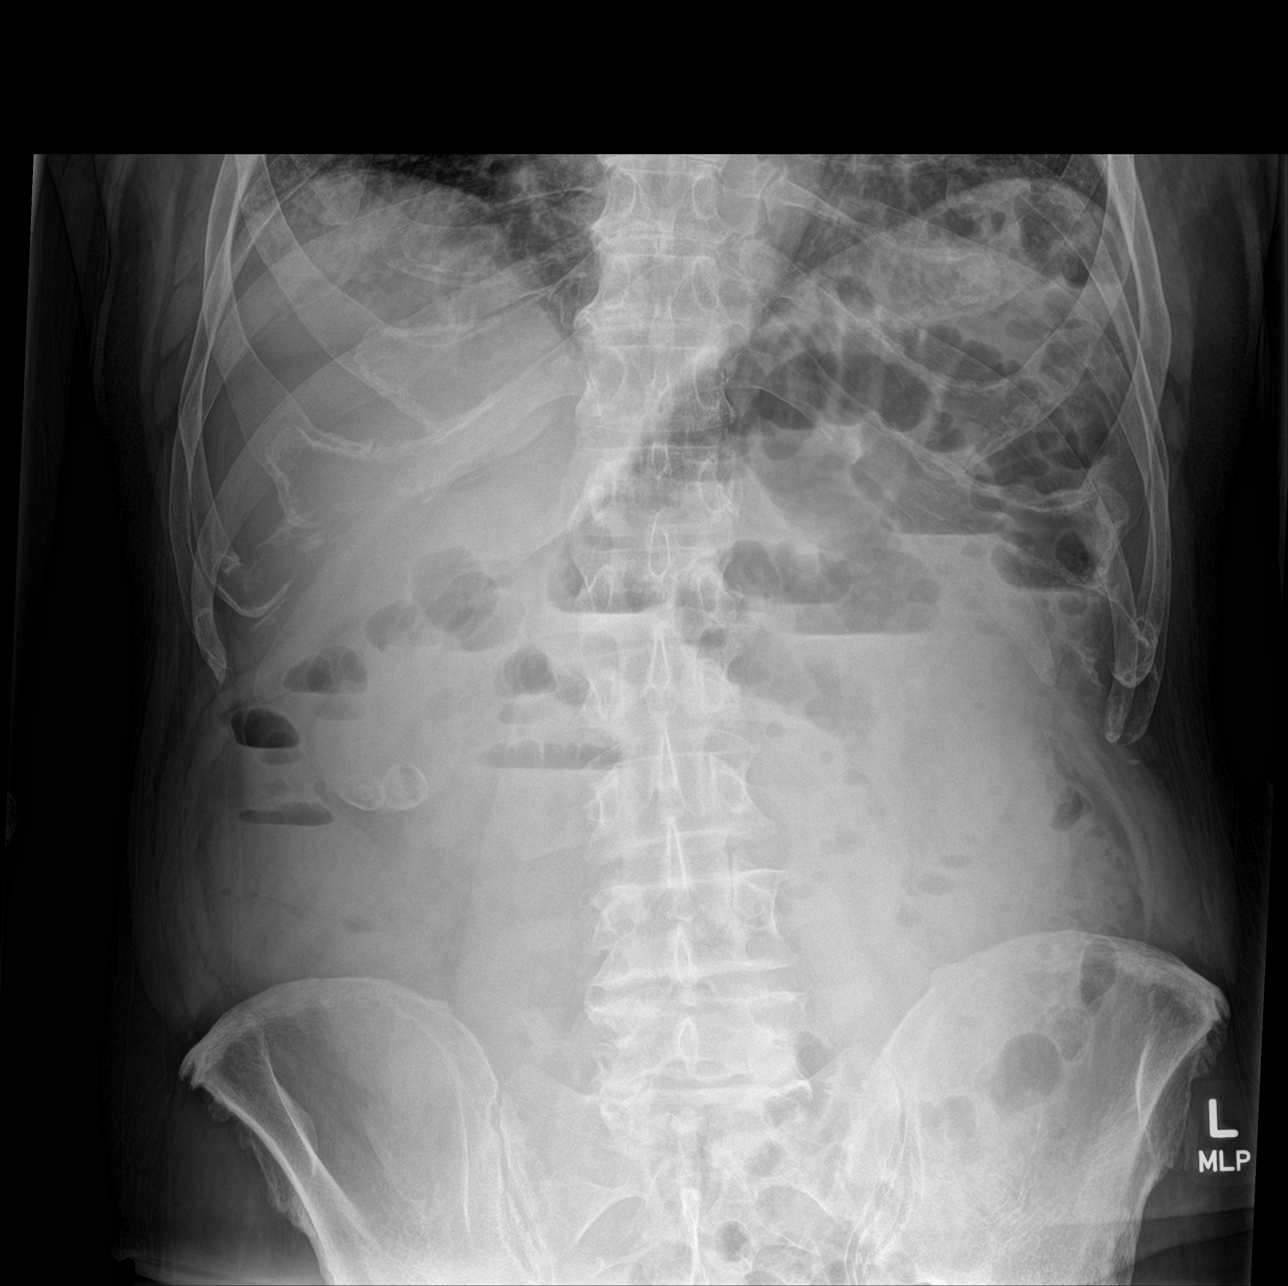

[abdomen kub (2 of 3)]
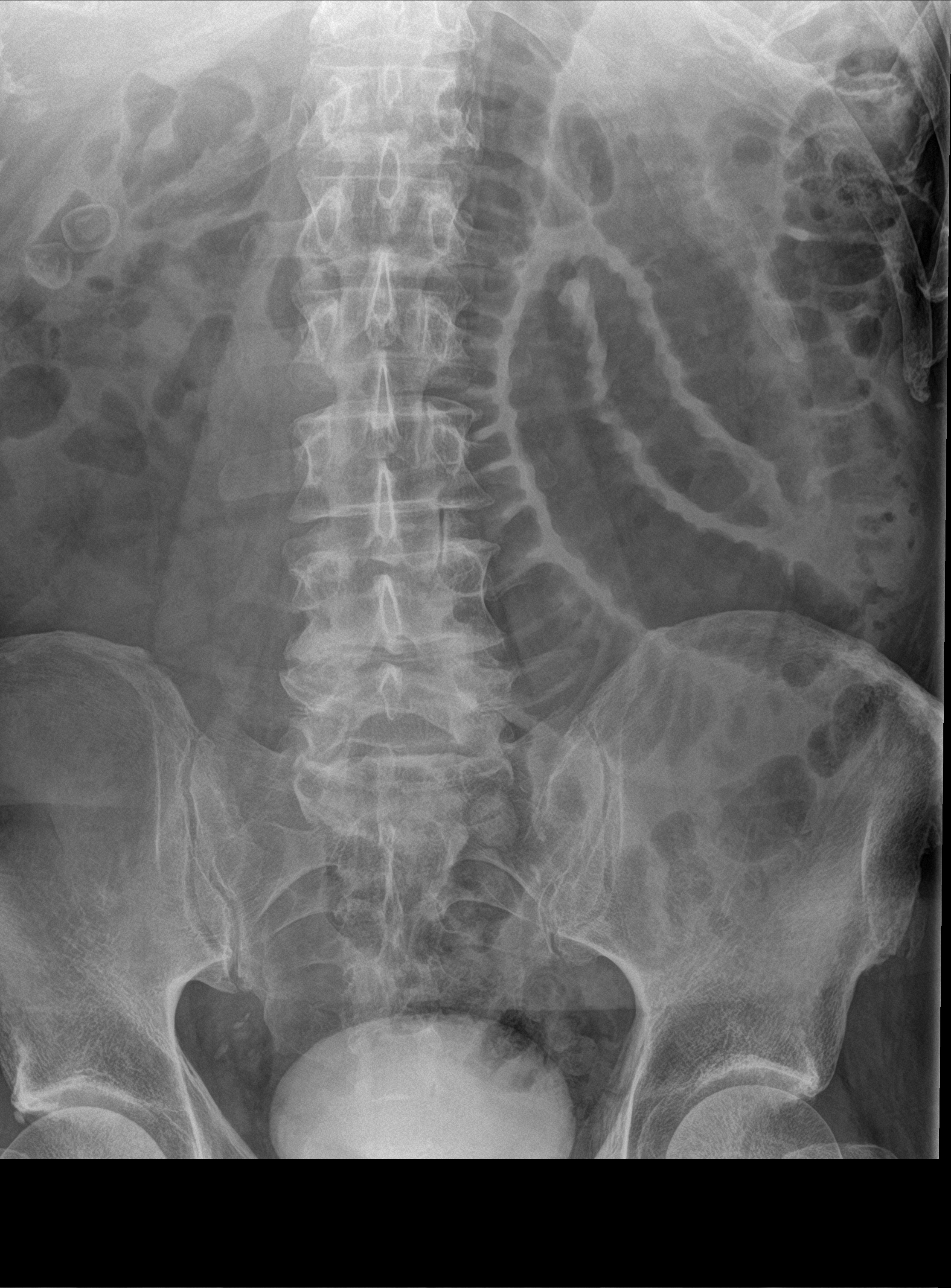

[abdomen kub (3 of 3)]
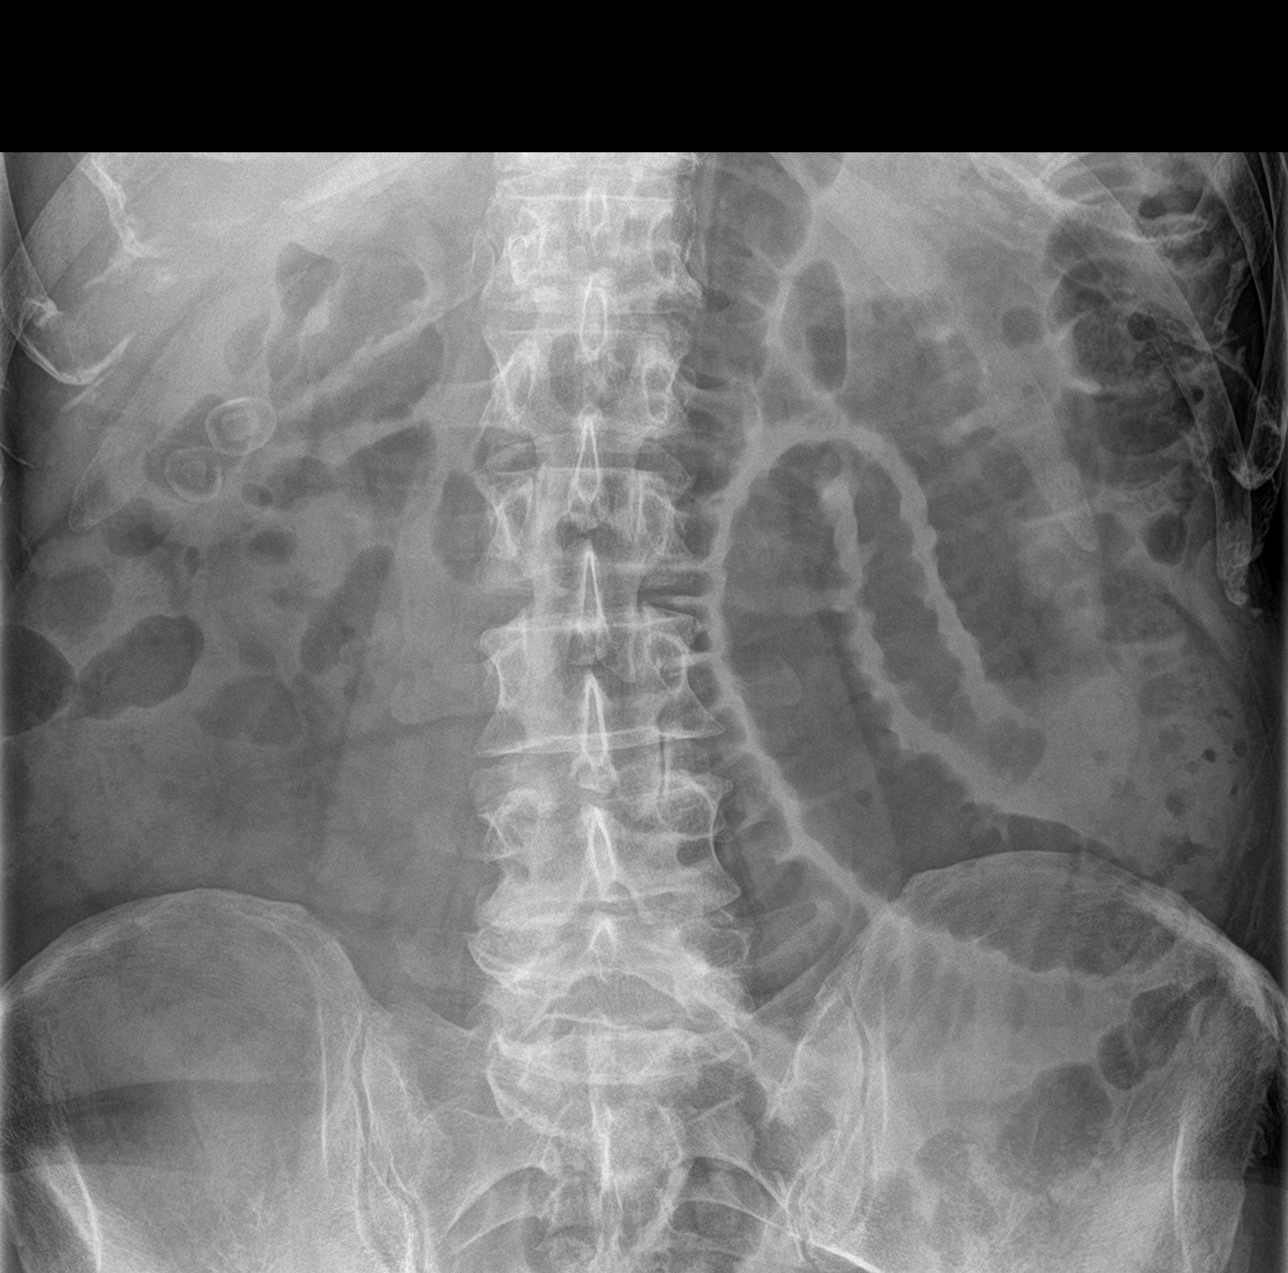

[3 of 3 positions shown; findings below may reference images not displayed]

FINDINGS: Multiple gaseous distended loops of small bowel are demonstrated
throughout the abdomen measuring up to approximately 4 cm. There are
differential air-fluid levels demonstrated on upright images. No
definite free intraperitoneal air. Lung bases are clear. Calcified
stones within the gallbladder. Lumbar spine degenerative changes.
IMPRESSION: Findings compatible with small-bowel obstruction.

## 2022-01-14 IMAGING — DX DG ABDOMEN 2V
2 series · 2 of 2 positions shown · non-contrast
Comparison: Abdominal x-ray from yesterday.

CLINICAL DATA: Small bowel obstruction follow-up.

EXAM:
ABDOMEN - 2 VIEW

[abdomen erect (1 of 2)]
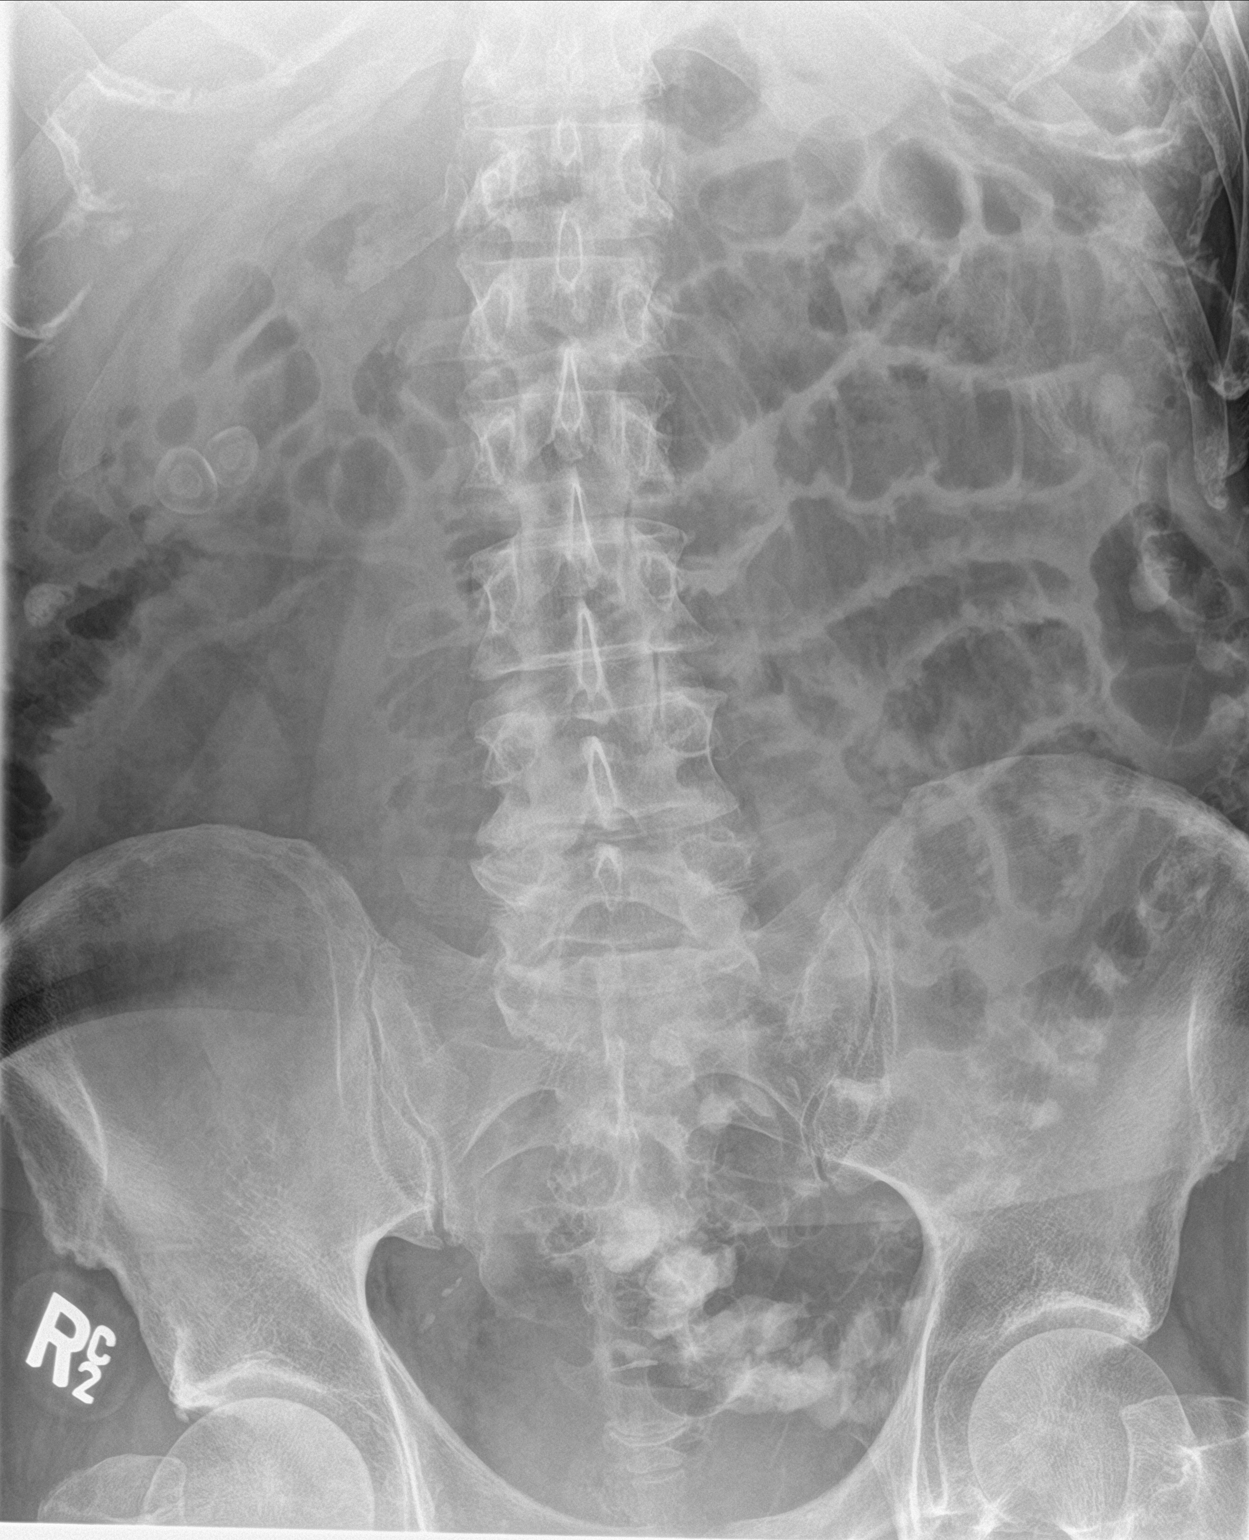

[abdomen erect (2 of 2)]
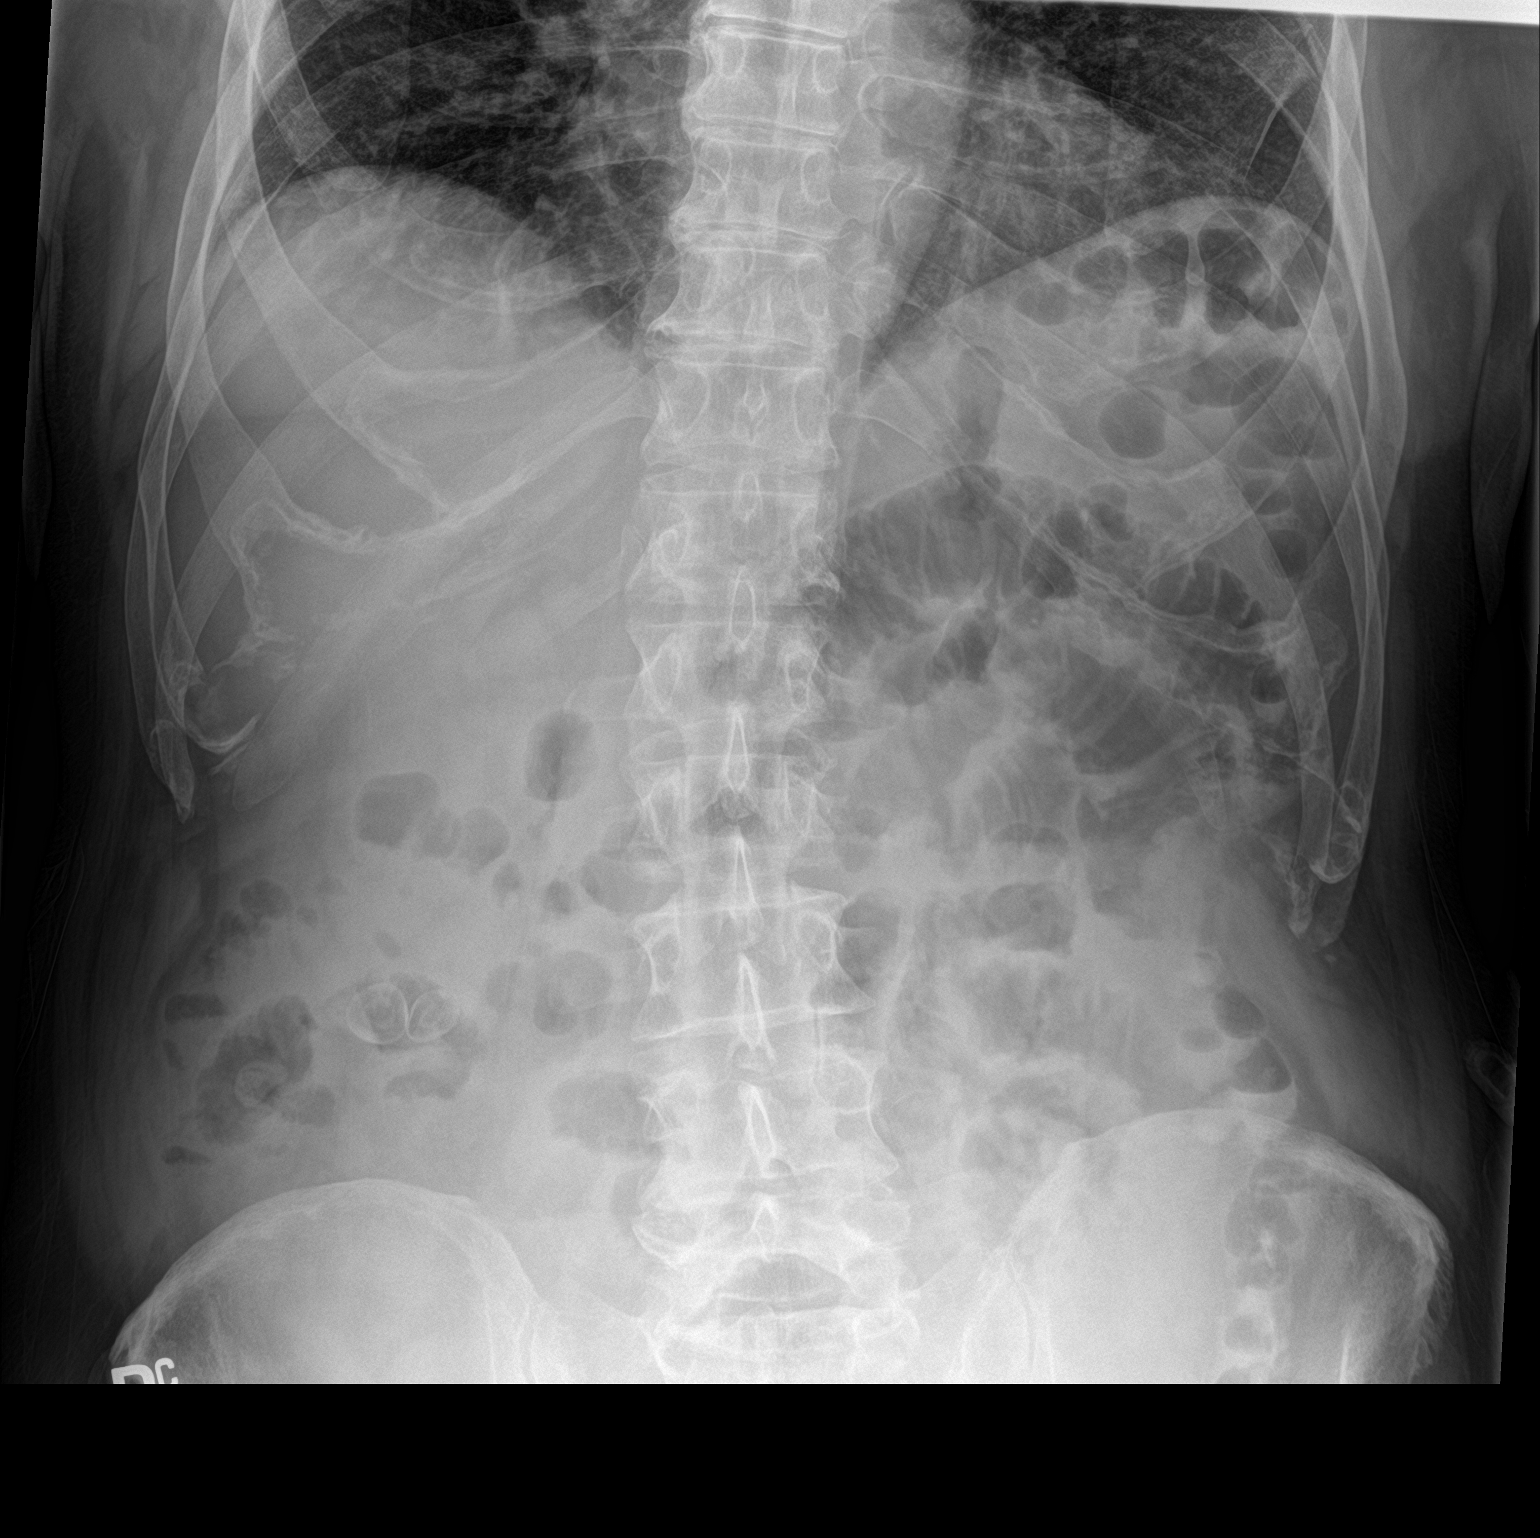

[2 of 2 positions shown; findings below may reference images not displayed]

FINDINGS: Unchanged small bowel dilatation in the left upper abdomen measuring
up to 4.2 cm. There is some residual contrast within the left colon.
No pneumoperitoneum. Unchanged cholelithiasis. No acute osseous
abnormality.
IMPRESSION: Unchanged partial small bowel obstruction.

## 2022-02-05 DIAGNOSIS — M7661 Achilles tendinitis, right leg: Secondary | ICD-10-CM | POA: Diagnosis not present

## 2022-04-14 DIAGNOSIS — E291 Testicular hypofunction: Secondary | ICD-10-CM | POA: Diagnosis not present

## 2022-04-21 DIAGNOSIS — N401 Enlarged prostate with lower urinary tract symptoms: Secondary | ICD-10-CM | POA: Diagnosis not present

## 2022-04-21 DIAGNOSIS — E291 Testicular hypofunction: Secondary | ICD-10-CM | POA: Diagnosis not present

## 2022-04-21 DIAGNOSIS — N5201 Erectile dysfunction due to arterial insufficiency: Secondary | ICD-10-CM | POA: Diagnosis not present

## 2022-04-21 DIAGNOSIS — R3912 Poor urinary stream: Secondary | ICD-10-CM | POA: Diagnosis not present

## 2022-07-31 ENCOUNTER — Ambulatory Visit (INDEPENDENT_AMBULATORY_CARE_PROVIDER_SITE_OTHER): Payer: Medicare Other | Admitting: Family Medicine

## 2022-07-31 ENCOUNTER — Encounter: Payer: Self-pay | Admitting: Family Medicine

## 2022-07-31 VITALS — BP 118/68 | HR 59 | Temp 98.2°F | Ht 70.5 in | Wt 188.2 lb

## 2022-07-31 DIAGNOSIS — S63641A Sprain of metacarpophalangeal joint of right thumb, initial encounter: Secondary | ICD-10-CM

## 2022-07-31 DIAGNOSIS — I700 Atherosclerosis of aorta: Secondary | ICD-10-CM | POA: Diagnosis not present

## 2022-07-31 DIAGNOSIS — Z125 Encounter for screening for malignant neoplasm of prostate: Secondary | ICD-10-CM

## 2022-07-31 DIAGNOSIS — S6010XA Contusion of unspecified finger with damage to nail, initial encounter: Secondary | ICD-10-CM

## 2022-07-31 DIAGNOSIS — M722 Plantar fascial fibromatosis: Secondary | ICD-10-CM | POA: Diagnosis not present

## 2022-07-31 DIAGNOSIS — W19XXXA Unspecified fall, initial encounter: Secondary | ICD-10-CM

## 2022-07-31 DIAGNOSIS — H6123 Impacted cerumen, bilateral: Secondary | ICD-10-CM | POA: Diagnosis not present

## 2022-07-31 DIAGNOSIS — H9190 Unspecified hearing loss, unspecified ear: Secondary | ICD-10-CM

## 2022-07-31 LAB — CBC WITH DIFFERENTIAL/PLATELET
Basophils Absolute: 0 10*3/uL (ref 0.0–0.1)
Basophils Relative: 0.7 % (ref 0.0–3.0)
Eosinophils Absolute: 0.1 10*3/uL (ref 0.0–0.7)
Eosinophils Relative: 1.3 % (ref 0.0–5.0)
HCT: 50.6 % (ref 39.0–52.0)
Hemoglobin: 16.5 g/dL (ref 13.0–17.0)
Lymphocytes Relative: 29.3 % (ref 12.0–46.0)
Lymphs Abs: 1.9 10*3/uL (ref 0.7–4.0)
MCHC: 32.6 g/dL (ref 30.0–36.0)
MCV: 96.6 fl (ref 78.0–100.0)
Monocytes Absolute: 0.6 10*3/uL (ref 0.1–1.0)
Monocytes Relative: 9.2 % (ref 3.0–12.0)
Neutro Abs: 3.8 10*3/uL (ref 1.4–7.7)
Neutrophils Relative %: 59.5 % (ref 43.0–77.0)
Platelets: 289 10*3/uL (ref 150.0–400.0)
RBC: 5.24 Mil/uL (ref 4.22–5.81)
RDW: 13.3 % (ref 11.5–15.5)
WBC: 6.3 10*3/uL (ref 4.0–10.5)

## 2022-07-31 LAB — COMPREHENSIVE METABOLIC PANEL
ALT: 15 U/L (ref 0–53)
AST: 17 U/L (ref 0–37)
Albumin: 4.5 g/dL (ref 3.5–5.2)
Alkaline Phosphatase: 60 U/L (ref 39–117)
BUN: 18 mg/dL (ref 6–23)
CO2: 32 mEq/L (ref 19–32)
Calcium: 9.7 mg/dL (ref 8.4–10.5)
Chloride: 102 mEq/L (ref 96–112)
Creatinine, Ser: 0.97 mg/dL (ref 0.40–1.50)
GFR: 74.99 mL/min (ref >60.00)
Glucose, Bld: 111 mg/dL — ABNORMAL HIGH (ref 70–99)
Potassium: 4.5 mEq/L (ref 3.5–5.1)
Sodium: 141 mEq/L (ref 135–145)
Total Bilirubin: 1 mg/dL (ref 0.2–1.2)
Total Protein: 7.2 g/dL (ref 6.0–8.3)

## 2022-07-31 LAB — LIPID PANEL
Cholesterol: 175 mg/dL (ref 0–200)
HDL: 66.5 mg/dL (ref >39.00)
LDL Cholesterol: 91 mg/dL (ref 0–99)
NonHDL: 108.59
Total CHOL/HDL Ratio: 3
Triglycerides: 86 mg/dL (ref 0.0–149.0)
VLDL: 17.2 mg/dL (ref 0.0–40.0)

## 2022-07-31 LAB — PSA, MEDICARE: PSA: 1.22 ng/ml (ref 0.10–4.00)

## 2022-07-31 NOTE — Progress Notes (Signed)
Established Patient Office Visit   Subjective  Patient ID: Donald Montgomery, male    DOB: 06-15-1945  Age: 77 y.o. MRN: [id number]  Chief Complaint  Patient presents with   Annual Exam  Had AWV on 11/22/2021.  Pt is a 77 yo male seen for f/u on conditions.  Patient states he has been doing well overall.  Had some issues with plantar fasciitis.  Seen by podiatry.  Did stretching which helped.  Not currently having pain in right foot.  Does note having a large bump at the back of his foot/ankle, nontender.  Patient endorses a recent fall.  While in a home-improvement store he tripped over an item in the aisle.  Patient states his knee was sore after the fall and he may have sprained his thumb as he reached out trying to stop himself from falling.  Right thumb slightly swollen no longer tender.  Unable to fully flex thumb over to right fifth digit.   Patient Active Problem List   Diagnosis Date Noted   SBO (small bowel obstruction) (HCC) 02/04/2019   Chronic throat clearing 04/14/2016   Chronic rhinitis 04/14/2016   Mild acid reflux 04/14/2016   Inguinal hernia, left 06/22/2013   Testosterone deficiency 04/29/2013   Erectile dysfunction 04/29/2013   Past Surgical History:  Procedure Laterality Date   TONSILLECTOMY     Social History   Tobacco Use   Smoking status: Former    Current packs/day: 18.00    Average packs/day: 18.0 packs/day for 1 year (18.0 ttl pk-yrs)    Types: Cigarettes, Pipe   Smokeless tobacco: Never   Tobacco comments:    will consider AAA check as he states he smoked over 100 cigerattes   Substance Use Topics   Alcohol use: No   Drug use: No   Family History  Problem Relation Age of Onset   Heart disease Mother    Stroke Brother    Heart disease Brother    Allergic rhinitis Neg Hx    Angioedema Neg Hx    Asthma Neg Hx    Eczema Neg Hx    Immunodeficiency Neg Hx    Urticaria Neg Hx    No Known Allergies    ROS Negative unless stated above     Objective:     BP 118/68 (BP Location: Right Arm, Patient Position: Sitting, Cuff Size: Normal)   Pulse (!) 59   Temp 98.2 F (36.8 C) (Oral)   Ht 5' 10.5" (1.791 m)   Wt 188 lb 3.2 oz (85.4 kg)   SpO2 98%   BMI 26.62 kg/m  BP Readings from Last 3 Encounters:  07/31/22 118/68  12/06/20 133/79  11/19/20 112/74   Wt Readings from Last 3 Encounters:  07/31/22 188 lb 3.2 oz (85.4 kg)  11/22/21 183 lb (83 kg)  11/19/20 188 lb 12.8 oz (85.6 kg)   Hearing Screening   500Hz  1000Hz  2000Hz  4000Hz   Right ear Fail Pass Pass Pass  Left ear Fail Pass Pass Fail      Physical Exam Constitutional:      Appearance: Normal appearance.  HENT:     Head: Normocephalic and atraumatic.     Right Ear: Tympanic membrane, ear canal and external ear normal. There is impacted cerumen.     Left Ear: Tympanic membrane, ear canal and external ear normal. There is impacted cerumen.     Nose: Nose normal.     Mouth/Throat:     Mouth: Mucous membranes are moist.  Pharynx: No oropharyngeal exudate or posterior oropharyngeal erythema.  Eyes:     General: No scleral icterus.    Extraocular Movements: Extraocular movements intact.     Conjunctiva/sclera: Conjunctivae normal.     Pupils: Pupils are equal, round, and reactive to light.  Neck:     Thyroid: No thyromegaly.  Cardiovascular:     Rate and Rhythm: Normal rate and regular rhythm.     Pulses: Normal pulses.     Heart sounds: Normal heart sounds. No murmur heard.    No friction rub.  Pulmonary:     Effort: Pulmonary effort is normal.     Breath sounds: Normal breath sounds. No wheezing, rhonchi or rales.  Abdominal:     General: Bowel sounds are normal.     Palpations: Abdomen is soft.     Tenderness: There is no abdominal tenderness.  Musculoskeletal:        General: No deformity. Normal range of motion.  Lymphadenopathy:     Cervical: No cervical adenopathy.  Skin:    General: Skin is warm and dry.     Findings: No lesion.      Comments: Fingernail of right first digit with lighter area of discoloration and small hematoma.  Neurological:     General: No focal deficit present.     Mental Status: He is alert and oriented to person, place, and time.  Psychiatric:        Mood and Affect: Mood normal.        Thought Content: Thought content normal.      No results found for any visits on 07/31/22.    Assessment & Plan:  Decreased hearing, unspecified laterality -hearing checked in clinic.  Overall pass, unable to hear 500 Hz bilaterally 4000 Hz in left ear. -Consider formal hearing test with audiologist. -     CBC with Differential/Platelet -     Comprehensive metabolic panel  Bilateral impacted cerumen -OTC Debrox eardrops  Sprain of metacarpophalangeal (MCP) joint of right thumb, initial encounter -Improving -Supportive care including exercises, Tylenol or NSAIDs, heat, or ice.  Fall from standing, initial encounter -     Comprehensive metabolic panel  Plantar fasciitis of right foot -Discussed stretching exercises, supportive shoes, avoid walking barefooted, Tylenol or NSAIDs as needed -For continued or worsening symptoms consider referral to PT -     CBC with Differential/Platelet  Atherosclerosis of aorta (HCC) -     Lipid panel -     Comprehensive metabolic panel  Subungual hematoma of fingernail, initial encounter -     CBC with Differential/Platelet  Screening for prostate cancer -     PSA, Medicare  Return if symptoms worsen or fail to improve.   Deeann Saint, MD

## 2022-09-22 DIAGNOSIS — M25562 Pain in left knee: Secondary | ICD-10-CM | POA: Diagnosis not present

## 2022-09-22 DIAGNOSIS — M25561 Pain in right knee: Secondary | ICD-10-CM | POA: Diagnosis not present

## 2022-09-25 DIAGNOSIS — Z23 Encounter for immunization: Secondary | ICD-10-CM | POA: Diagnosis not present

## 2022-10-06 DIAGNOSIS — Z23 Encounter for immunization: Secondary | ICD-10-CM | POA: Diagnosis not present

## 2022-10-14 DIAGNOSIS — H4912 Fourth [trochlear] nerve palsy, left eye: Secondary | ICD-10-CM | POA: Diagnosis not present

## 2022-10-14 DIAGNOSIS — H5022 Vertical strabismus, left eye: Secondary | ICD-10-CM | POA: Diagnosis not present

## 2022-10-14 DIAGNOSIS — H524 Presbyopia: Secondary | ICD-10-CM | POA: Diagnosis not present

## 2022-10-14 DIAGNOSIS — H532 Diplopia: Secondary | ICD-10-CM | POA: Diagnosis not present

## 2022-10-14 DIAGNOSIS — H25013 Cortical age-related cataract, bilateral: Secondary | ICD-10-CM | POA: Diagnosis not present

## 2022-10-14 DIAGNOSIS — H5203 Hypermetropia, bilateral: Secondary | ICD-10-CM | POA: Diagnosis not present

## 2022-10-14 DIAGNOSIS — H518 Other specified disorders of binocular movement: Secondary | ICD-10-CM | POA: Diagnosis not present

## 2022-10-14 DIAGNOSIS — H52203 Unspecified astigmatism, bilateral: Secondary | ICD-10-CM | POA: Diagnosis not present

## 2022-10-15 DIAGNOSIS — E291 Testicular hypofunction: Secondary | ICD-10-CM | POA: Diagnosis not present

## 2022-10-15 DIAGNOSIS — N401 Enlarged prostate with lower urinary tract symptoms: Secondary | ICD-10-CM | POA: Diagnosis not present

## 2022-10-29 DIAGNOSIS — E291 Testicular hypofunction: Secondary | ICD-10-CM | POA: Diagnosis not present

## 2022-10-29 DIAGNOSIS — N401 Enlarged prostate with lower urinary tract symptoms: Secondary | ICD-10-CM | POA: Diagnosis not present

## 2022-10-29 DIAGNOSIS — R350 Frequency of micturition: Secondary | ICD-10-CM | POA: Diagnosis not present

## 2022-10-29 DIAGNOSIS — N5201 Erectile dysfunction due to arterial insufficiency: Secondary | ICD-10-CM | POA: Diagnosis not present

## 2022-12-11 DIAGNOSIS — H00014 Hordeolum externum left upper eyelid: Secondary | ICD-10-CM | POA: Diagnosis not present

## 2022-12-11 DIAGNOSIS — H532 Diplopia: Secondary | ICD-10-CM | POA: Diagnosis not present

## 2022-12-11 DIAGNOSIS — H4912 Fourth [trochlear] nerve palsy, left eye: Secondary | ICD-10-CM | POA: Diagnosis not present

## 2022-12-11 DIAGNOSIS — H10523 Angular blepharoconjunctivitis, bilateral: Secondary | ICD-10-CM | POA: Diagnosis not present

## 2022-12-26 ENCOUNTER — Telehealth: Payer: Self-pay

## 2022-12-26 NOTE — Telephone Encounter (Signed)
Unsuccessful attempts to reach patient on preferred numbers listed in notes for scheduled AWV. Left message on voicemail okay to reschedule

## 2023-01-20 DIAGNOSIS — L814 Other melanin hyperpigmentation: Secondary | ICD-10-CM | POA: Diagnosis not present

## 2023-01-20 DIAGNOSIS — H10523 Angular blepharoconjunctivitis, bilateral: Secondary | ICD-10-CM | POA: Diagnosis not present

## 2023-01-20 DIAGNOSIS — D225 Melanocytic nevi of trunk: Secondary | ICD-10-CM | POA: Diagnosis not present

## 2023-01-20 DIAGNOSIS — D0439 Carcinoma in situ of skin of other parts of face: Secondary | ICD-10-CM | POA: Diagnosis not present

## 2023-01-20 DIAGNOSIS — H25013 Cortical age-related cataract, bilateral: Secondary | ICD-10-CM | POA: Diagnosis not present

## 2023-01-20 DIAGNOSIS — Z86018 Personal history of other benign neoplasm: Secondary | ICD-10-CM | POA: Diagnosis not present

## 2023-01-20 DIAGNOSIS — D485 Neoplasm of uncertain behavior of skin: Secondary | ICD-10-CM | POA: Diagnosis not present

## 2023-01-20 DIAGNOSIS — L570 Actinic keratosis: Secondary | ICD-10-CM | POA: Diagnosis not present

## 2023-01-20 DIAGNOSIS — Z85828 Personal history of other malignant neoplasm of skin: Secondary | ICD-10-CM | POA: Diagnosis not present

## 2023-01-20 DIAGNOSIS — D2271 Melanocytic nevi of right lower limb, including hip: Secondary | ICD-10-CM | POA: Diagnosis not present

## 2023-01-20 DIAGNOSIS — L578 Other skin changes due to chronic exposure to nonionizing radiation: Secondary | ICD-10-CM | POA: Diagnosis not present

## 2023-01-20 DIAGNOSIS — H5022 Vertical strabismus, left eye: Secondary | ICD-10-CM | POA: Diagnosis not present

## 2023-01-20 DIAGNOSIS — H4912 Fourth [trochlear] nerve palsy, left eye: Secondary | ICD-10-CM | POA: Diagnosis not present

## 2023-01-20 DIAGNOSIS — L821 Other seborrheic keratosis: Secondary | ICD-10-CM | POA: Diagnosis not present

## 2023-04-17 DIAGNOSIS — E291 Testicular hypofunction: Secondary | ICD-10-CM | POA: Diagnosis not present

## 2023-04-17 DIAGNOSIS — N401 Enlarged prostate with lower urinary tract symptoms: Secondary | ICD-10-CM | POA: Diagnosis not present

## 2023-04-29 DIAGNOSIS — N401 Enlarged prostate with lower urinary tract symptoms: Secondary | ICD-10-CM | POA: Diagnosis not present

## 2023-04-29 DIAGNOSIS — R350 Frequency of micturition: Secondary | ICD-10-CM | POA: Diagnosis not present

## 2023-04-29 DIAGNOSIS — N5201 Erectile dysfunction due to arterial insufficiency: Secondary | ICD-10-CM | POA: Diagnosis not present

## 2023-04-29 DIAGNOSIS — E291 Testicular hypofunction: Secondary | ICD-10-CM | POA: Diagnosis not present

## 2023-05-06 DIAGNOSIS — L821 Other seborrheic keratosis: Secondary | ICD-10-CM | POA: Diagnosis not present

## 2023-05-06 DIAGNOSIS — D0439 Carcinoma in situ of skin of other parts of face: Secondary | ICD-10-CM | POA: Diagnosis not present

## 2023-05-14 ENCOUNTER — Telehealth: Payer: Self-pay | Admitting: *Deleted

## 2023-05-14 NOTE — Telephone Encounter (Signed)
 Copied from CRM [id number]. Topic: Appointments - Other >> May 14, 2023 10:28 AM Dimple Francis wrote: Reason for CRM: Patient was supposed to have his Medicare Wellness phone call appointment at 10:00am today and he has been waiting and no one has called him. He had to leave home so if they do need to call him, to call his cell phone [phone]

## 2023-05-15 ENCOUNTER — Ambulatory Visit (INDEPENDENT_AMBULATORY_CARE_PROVIDER_SITE_OTHER): Payer: Medicare Other

## 2023-05-15 VITALS — Ht 69.5 in | Wt 188.0 lb

## 2023-05-15 DIAGNOSIS — Z0000 Encounter for general adult medical examination without abnormal findings: Secondary | ICD-10-CM | POA: Diagnosis not present

## 2023-05-15 NOTE — Patient Instructions (Addendum)
 Donald Montgomery , Thank you for taking time out of your busy schedule to complete your Annual Wellness Visit with me. I enjoyed our conversation and look forward to speaking with you again next year. I, as well as your care team,  appreciate your ongoing commitment to your health goals. Please review the following plan we discussed and let me know if I can assist you in the future. Your Game plan/ To Do List    Referrals: If you haven't heard from the office you've been referred to, please reach out to them at the phone provided.   Follow up Visits: Next Medicare AWV with our clinical staff: 05/20/24   Have you seen your provider in the last 6 months (3 months if uncontrolled diabetes)? Yes 07/31/22 Next Office Visit with your provider: Deferred  Clinician Recommendations:  Aim for 30 minutes of exercise or brisk walking, 6-8 glasses of water, and 5 servings of fruits and vegetables each day.       This is a list of the screening recommended for you and due dates:  Health Maintenance  Topic Date Due   Zoster (Shingles) Vaccine (1 of 2) 06/26/1994   DTaP/Tdap/Td vaccine (2 - Td or Tdap) 01/08/2022   COVID-19 Vaccine (6 - 2024-25 season) 09/07/2022   Flu Shot  08/07/2023   Medicare Annual Wellness Visit  05/14/2024   Pneumonia Vaccine  Completed   Hepatitis C Screening  Completed   HPV Vaccine  Aged Out   Meningitis B Vaccine  Aged Out   Colon Cancer Screening  Discontinued    Advanced directives: (Copy Requested) Please bring a copy of your health care power of attorney and living will to the office to be added to your chart at your convenience. You can mail to [facility] [address] [place], [place] [address] or email to [email or web address] Advance Care Planning is important because it:  [x]  Makes sure you receive the medical care that is consistent with your values, goals, and preferences  [x]  It provides guidance to your family and loved ones and reduces  their decisional burden about whether or not they are making the right decisions based on your wishes.  Follow the link provided in your after visit summary or read over the paperwork we have mailed to you to help you started getting your Advance Directives in place. If you need assistance in completing these, please reach out to us  so that we can help you!  See attachments for Preventive Care and Fall Prevention Tips.

## 2023-05-15 NOTE — Progress Notes (Signed)
 Subjective:   Donald Montgomery is a 78 y.o. who presents for a Medicare Wellness preventive visit.  As a reminder, Annual Wellness Visits don't include a physical exam, and some assessments may be limited, especially if this visit is performed virtually. We may recommend an in-person visit if needed.  Visit Complete: Virtual I connected with  Donald Montgomery on 05/15/23 by a audio enabled telemedicine application and verified that I am speaking with the correct person using two identifiers.  Patient Location: Home  Provider Location: Home Office  I discussed the limitations of evaluation and management by telemedicine. The patient expressed understanding and agreed to proceed.  Vital Signs: Because this visit was a virtual/telehealth visit, some criteria may be missing or patient reported. Any vitals not documented were not able to be obtained and vitals that have been documented are patient reported.    Persons Participating in Visit: Patient.  AWV Questionnaire: No: Patient Medicare AWV questionnaire was not completed prior to this visit.  Cardiac Risk Factors include: advanced age (>63men, >52 women);male gender     Objective:     Today's Vitals   05/15/23 1003  Weight: 188 lb (85.3 kg)  Height: 5' 9.5" (1.765 m)   Body mass index is 27.36 kg/m.     05/15/2023   10:09 AM 11/22/2021   11:07 AM 08/13/2021    4:30 PM 08/30/2019    3:07 PM 07/01/2017    1:28 PM 03/04/2016   10:38 AM  Advanced Directives  Does Patient Have a Medical Advance Directive? Yes Yes No Yes Yes Yes  Type of Estate agent of [place];Living will Healthcare Power of [place];Living will  Healthcare Power of [place];Living will    Does patient want to make changes to medical advance directive?    No - Patient declined    Copy of Healthcare Power of Attorney in Chart? No - copy requested No - copy requested  No - copy requested    Would patient like information on creating a  medical advance directive?   No - Patient declined       Current Medications (verified) Outpatient Encounter Medications as of 05/15/2023  Medication Sig   ANDROGEL  PUMP 20.25 MG/ACT (1.62%) GEL Apply 4 application topically See admin instructions. Apply 2 pumps to each shoulder once a day   b complex vitamins tablet Take 1 tablet by mouth daily with supper.   Black Elderberry (SAMBUCUS ELDERBERRY PO) Take 1 tablet by mouth daily.    Cholecalciferol (VITAMIN D-3) 25 MCG (1000 UT) CAPS Take 2,000 Units by mouth daily with supper.   Coenzyme Q10 (CO Q 10 PO) Take 1 capsule by mouth daily.   ibuprofen (ADVIL) 200 MG tablet Take 200 mg by mouth every 6 (six) hours as needed for headache or mild pain.   L-ARGININE PO Take 1 tablet by mouth daily with lunch.   MAGNESIUM PO Take 1 tablet by mouth daily with supper.   Omega-3 Fatty Acids (FISH OIL) 1000 MG CAPS Take 1,000 mg by mouth daily with supper.    polyethylene glycol (MIRALAX ) 17 g packet Take 17 g by mouth daily as needed for moderate constipation.   Probiotic Product (ALIGN) 4 MG CAPS Take 4 mg by mouth daily.   tadalafil (CIALIS) 5 MG tablet Take 10-15 mg by mouth daily as needed for erectile dysfunction.    No facility-administered encounter medications on file as of 05/15/2023.    Allergies (verified) Patient has no known allergies.  History: Past Medical History:  Diagnosis Date   Cancer [facility])    Past Surgical History:  Procedure Laterality Date   TONSILLECTOMY     Family History  Problem Relation Age of Onset   Heart disease Mother    Stroke Brother    Heart disease Brother    Allergic rhinitis Neg Hx    Angioedema Neg Hx    Asthma Neg Hx    Eczema Neg Hx    Immunodeficiency Neg Hx    Urticaria Neg Hx    Social History   Socioeconomic History   Marital status: Married    Spouse name: Not on file   Number of children: Not on file   Years of education: Not on file   Highest education level: Not on file   Occupational History   Not on file  Tobacco Use   Smoking status: Former    Current packs/day: 18.00    Average packs/day: 18.0 packs/day for 1 year (18.0 ttl pk-yrs)    Types: Cigarettes, Pipe   Smokeless tobacco: Never   Tobacco comments:    will consider AAA check as he states he smoked over 100 cigerattes   Substance and Sexual Activity   Alcohol use: No   Drug use: No   Sexual activity: Not on file  Other Topics Concern   Not on file  Social History Narrative   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    Spends time with [employer] dtr    Social Drivers of Health   Financial Resource Strain: Low Risk  (05/15/2023)   Overall Financial Resource Strain (CARDIA)    Difficulty of Paying Living Expenses: Not hard at all  Food Insecurity: No Food Insecurity (05/15/2023)   Hunger Vital Sign    Worried About Running Out of Food in the Last Year: Never true    Ran Out of Food in the Last Year: Never true  Transportation Needs: No Transportation Needs (05/15/2023)   PRAPARE - Administrator, Civil Service (Medical): No    Lack of Transportation (Non-Medical): No  Physical Activity: Sufficiently Active (05/15/2023)   Exercise Vital Sign    Days of Exercise per Week: 7 days    Minutes of Exercise per Session: 60 min  Stress: No Stress Concern Present (05/15/2023)   [employer] of Occupational Health - Occupational Stress Questionnaire    Feeling of Stress : Not at all  Social Connections: Socially Integrated (05/15/2023)   Social Connection and Isolation Panel [NHANES]    Frequency of Communication with Friends and Family: More than three times a week    Frequency of Social Gatherings with Friends and Family: More than three times a week    Attends Religious Services: More than 4 times per year    Active Member of [employer] or Organizations: Yes    Attends Engineer, structural: More than 4 times per year    Marital Status: Married    Tobacco  Counseling Counseling given: Not Answered Tobacco comments: will consider AAA check as he states he smoked over 100 cigerattes     Clinical Intake:  Pre-visit preparation completed: Yes  Pain : No/denies pain     BMI - recorded: 27.36 Nutritional Status: BMI 25 -29 Overweight Nutritional Risks: None Diabetes: No  No results found for: "HGBA1C"   How often do you need to have someone help you when you read instructions, pamphlets, or other written materials from your doctor or  pharmacy?: 1 - Never  Interpreter Needed?: No  Information entered by :: Farris Hong LPN   Activities of Daily Living     05/15/2023   10:08 AM  In your present state of health, do you have any difficulty performing the following activities:  Hearing? 0  Vision? 0  Difficulty concentrating or making decisions? 0  Walking or climbing stairs? 0  Dressing or bathing? 0  Doing errands, shopping? 0  Preparing Food and eating ? N  Using the Toilet? N  In the past six months, have you accidently leaked urine? N  Do you have problems with loss of bowel control? N  Managing your Medications? N  Managing your Finances? N  Housekeeping or managing your Housekeeping? N    Patient Care Team: Viola Greulich, MD as PCP - General (Family Medicine) Homero Luster, MD as Attending Physician (Urology)  Indicate any recent Medical Services you may have received from other than [facility] providers in the past year (date may be approximate).     Assessment:    This is a routine wellness examination for [place].  Hearing/Vision screen Hearing Screening - Comments:: Denies hearing difficulties   Vision Screening - Comments:: Wears rx glasses - up to date with routine eye exams with  Dr Teena Feast   Goals Addressed               This Visit's Progress     Remain active (pt-stated)         Depression Screen     05/15/2023   10:07 AM 11/22/2021   11:05 AM 11/19/2020   11:13 AM 08/30/2019    3:18 PM  07/01/2017    1:35 PM 03/04/2016   10:41 AM 02/26/2015    9:36 AM  PHQ 2/9 Scores  PHQ - 2 Score 0 0 0 0 0 0 0  PHQ- 9 Score   0 0       Fall Risk     05/15/2023   10:09 AM 07/31/2022    9:58 AM 11/22/2021   11:07 AM 08/30/2019    3:14 PM 07/01/2017    1:35 PM  Fall Risk   Falls in the past year? 1 1 0 0 No  Number falls in past yr: 0 0 0 0   Comment  tripped     Injury with Fall? 0 0 0 0   Risk for fall due to : No Fall Risks Other (Comment) No Fall Risks Medication side effect   Follow up Falls prevention discussed;Falls evaluation completed Falls evaluation completed Falls prevention discussed Falls evaluation completed;Falls prevention discussed     MEDICARE RISK AT HOME:  Medicare Risk at Home Any stairs in or around the home?: Yes If so, are there any without handrails?: No Home free of loose throw rugs in walkways, pet beds, electrical cords, etc?: Yes Adequate lighting in your home to reduce risk of falls?: Yes Life alert?: No Use of a cane, walker or w/c?: No Grab bars in the bathroom?: No Shower chair or bench in shower?: Yes Elevated toilet seat or a handicapped toilet?: No  TIMED UP AND GO:  Was the test performed?  No  Cognitive Function: 6CIT completed    07/01/2017    1:39 PM 03/04/2016   10:44 AM  MMSE - Mini Mental State Exam  Not completed: -- --        05/15/2023   10:09 AM 11/22/2021   11:07 AM 08/30/2019    3:21 PM 07/01/2017  1:35 PM  6CIT Screen  What Year? 0 points 0 points 0 points 0 points  What month? 0 points 0 points 0 points 0 points  What time? 0 points 0 points 0 points 0 points  Count back from 20 0 points 0 points 0 points 0 points  Months in reverse 0 points 0 points 0 points 0 points  Repeat phrase 0 points 0 points 0 points 0 points  Total Score 0 points 0 points 0 points 0 points    Immunizations Immunization History  Administered Date(s) Administered   Fluad Quad(high Dose 65+) 09/06/2018   Influenza Whole 10/10/2009    Influenza, High Dose Seasonal PF 10/13/2013, 11/28/2014, 10/10/2015, 09/09/2016, 10/19/2017   Influenza-Unspecified 10/13/2013, 11/28/2014, 10/10/2015, 09/09/2016, 10/19/2017   Moderna Sars-Covid-2 Vaccination 02/01/2019, 03/01/2019, 11/02/2019, 04/19/2020, 11/03/2020   Novel Infuenza-h1n1-09 01/03/2008   Pneumococcal Conjugate-13 02/26/2015   Pneumococcal Polysaccharide-23 12/01/2011   Tdap 01/09/2012   Zoster, Live 01/09/2012    Screening Tests Health Maintenance  Topic Date Due   Zoster Vaccines- Shingrix (1 of 2) 06/26/1994   DTaP/Tdap/Td (2 - Td or Tdap) 01/08/2022   COVID-19 Vaccine (6 - 2024-25 season) 09/07/2022   INFLUENZA VACCINE  08/07/2023   Medicare Annual Wellness (AWV)  05/14/2024   Pneumonia Vaccine 62+ Years old  Completed   Hepatitis C Screening  Completed   HPV VACCINES  Aged Out   Meningococcal B Vaccine  Aged Out   Colonoscopy  Discontinued    Health Maintenance  Health Maintenance Due  Topic Date Due   Zoster Vaccines- Shingrix (1 of 2) 06/26/1994   DTaP/Tdap/Td (2 - Td or Tdap) 01/08/2022   COVID-19 Vaccine (6 - 2024-25 season) 09/07/2022   Health Maintenance Items Addressed:   Additional Screening:  Vision Screening: Recommended annual ophthalmology exams for early detection of glaucoma and other disorders of the eye.  Dental Screening: Recommended annual dental exams for proper oral hygiene  Community Resource Referral / Chronic Care Management: CRR required this visit?  No   CCM required this visit?  No   Plan:    I have personally reviewed and noted the following in the patient's chart:   Medical and social history Use of alcohol, tobacco or illicit drugs  Current medications and supplements including opioid prescriptions. Patient is not currently taking opioid prescriptions. Functional ability and status Nutritional status Physical activity Advanced directives List of other physicians Hospitalizations, surgeries, and ER visits  in previous 12 months Vitals Screenings to include cognitive, depression, and falls Referrals and appointments  In addition, I have reviewed and discussed with patient certain preventive protocols, quality metrics, and best practice recommendations. A written personalized care plan for preventive services as well as general preventive health recommendations were provided to patient.   Dewayne Ford, LPN   01/06/9145   After Visit Summary: (MyChart) Due to this being a telephonic visit, the after visit summary with patients personalized plan was offered to patient via MyChart   Notes: Nothing significant to report at this time.

## 2023-10-06 ENCOUNTER — Telehealth: Payer: Self-pay | Admitting: Family Medicine

## 2023-10-06 NOTE — Telephone Encounter (Signed)
 FYI  Copied from CRM [id number]. Topic: Appointments - Transfer of Care >> Oct 06, 2023 10:50 AM Rea ORN wrote: Pt is requesting to transfer FROM: Dr. Mercer Pt is requesting to transfer TO: Dr. Jesus Reason for requested transfer: Pt Preference It is the responsibility of the team the patient would like to transfer to (Dr. Jesus) to reach out to the patient if for any reason this transfer is not acceptable.

## 2023-10-07 NOTE — Telephone Encounter (Signed)
 Ok

## 2023-10-10 DIAGNOSIS — Z23 Encounter for immunization: Secondary | ICD-10-CM | POA: Diagnosis not present

## 2023-10-14 DIAGNOSIS — D485 Neoplasm of uncertain behavior of skin: Secondary | ICD-10-CM | POA: Diagnosis not present

## 2023-10-14 DIAGNOSIS — C44612 Basal cell carcinoma of skin of right upper limb, including shoulder: Secondary | ICD-10-CM | POA: Diagnosis not present

## 2023-10-21 DIAGNOSIS — E291 Testicular hypofunction: Secondary | ICD-10-CM | POA: Diagnosis not present

## 2023-10-28 DIAGNOSIS — E291 Testicular hypofunction: Secondary | ICD-10-CM | POA: Diagnosis not present

## 2023-10-28 DIAGNOSIS — N5201 Erectile dysfunction due to arterial insufficiency: Secondary | ICD-10-CM | POA: Diagnosis not present

## 2023-11-05 DIAGNOSIS — C44612 Basal cell carcinoma of skin of right upper limb, including shoulder: Secondary | ICD-10-CM | POA: Diagnosis not present

## 2023-11-05 DIAGNOSIS — L905 Scar conditions and fibrosis of skin: Secondary | ICD-10-CM | POA: Diagnosis not present

## 2023-11-18 DIAGNOSIS — Z23 Encounter for immunization: Secondary | ICD-10-CM | POA: Diagnosis not present

## 2023-11-23 ENCOUNTER — Encounter: Payer: Self-pay | Admitting: Internal Medicine

## 2023-11-23 ENCOUNTER — Ambulatory Visit (INDEPENDENT_AMBULATORY_CARE_PROVIDER_SITE_OTHER): Admitting: Internal Medicine

## 2023-11-23 VITALS — BP 100/60 | HR 74 | Temp 98.3°F | Ht 69.5 in | Wt 189.2 lb

## 2023-11-23 DIAGNOSIS — L578 Other skin changes due to chronic exposure to nonionizing radiation: Secondary | ICD-10-CM

## 2023-11-23 DIAGNOSIS — J309 Allergic rhinitis, unspecified: Secondary | ICD-10-CM

## 2023-11-23 DIAGNOSIS — H509 Unspecified strabismus: Secondary | ICD-10-CM

## 2023-11-23 DIAGNOSIS — Z0001 Encounter for general adult medical examination with abnormal findings: Secondary | ICD-10-CM

## 2023-11-23 DIAGNOSIS — K56600 Partial intestinal obstruction, unspecified as to cause: Secondary | ICD-10-CM

## 2023-11-23 DIAGNOSIS — R419 Unspecified symptoms and signs involving cognitive functions and awareness: Secondary | ICD-10-CM | POA: Diagnosis not present

## 2023-11-23 DIAGNOSIS — E559 Vitamin D deficiency, unspecified: Secondary | ICD-10-CM

## 2023-11-23 DIAGNOSIS — Z85828 Personal history of other malignant neoplasm of skin: Secondary | ICD-10-CM | POA: Insufficient documentation

## 2023-11-23 DIAGNOSIS — N182 Chronic kidney disease, stage 2 (mild): Secondary | ICD-10-CM | POA: Diagnosis not present

## 2023-11-23 DIAGNOSIS — R140 Abdominal distension (gaseous): Secondary | ICD-10-CM | POA: Diagnosis not present

## 2023-11-23 DIAGNOSIS — R739 Hyperglycemia, unspecified: Secondary | ICD-10-CM

## 2023-11-23 DIAGNOSIS — E349 Endocrine disorder, unspecified: Secondary | ICD-10-CM

## 2023-11-23 DIAGNOSIS — Z79899 Other long term (current) drug therapy: Secondary | ICD-10-CM | POA: Diagnosis not present

## 2023-11-23 DIAGNOSIS — N5201 Erectile dysfunction due to arterial insufficiency: Secondary | ICD-10-CM

## 2023-11-23 LAB — COMPREHENSIVE METABOLIC PANEL WITH GFR
ALT: 18 U/L (ref 0–53)
AST: 20 U/L (ref 0–37)
Albumin: 4.1 g/dL (ref 3.5–5.2)
Alkaline Phosphatase: 52 U/L (ref 39–117)
BUN: 25 mg/dL — ABNORMAL HIGH (ref 6–23)
CO2: 28 meq/L (ref 19–32)
Calcium: 9.1 mg/dL (ref 8.4–10.5)
Chloride: 104 meq/L (ref 96–112)
Creatinine, Ser: 0.89 mg/dL (ref 0.40–1.50)
GFR: 81.56 mL/min (ref >60.00)
Glucose, Bld: 99 mg/dL (ref 70–99)
Potassium: 4.7 meq/L (ref 3.5–5.1)
Sodium: 140 meq/L (ref 135–145)
Total Bilirubin: 0.7 mg/dL (ref 0.2–1.2)
Total Protein: 6.3 g/dL (ref 6.0–8.3)

## 2023-11-23 LAB — LIPID PANEL
Cholesterol: 143 mg/dL (ref 0–200)
HDL: 58.6 mg/dL (ref >39.00)
LDL Cholesterol: 65 mg/dL (ref 0–99)
NonHDL: 84.3
Total CHOL/HDL Ratio: 2
Triglycerides: 98 mg/dL (ref 0.0–149.0)
VLDL: 19.6 mg/dL (ref 0.0–40.0)

## 2023-11-23 LAB — CBC WITH DIFFERENTIAL/PLATELET
Basophils Absolute: 0 K/uL (ref 0.0–0.1)
Basophils Relative: 0.7 % (ref 0.0–3.0)
Eosinophils Absolute: 0.1 K/uL (ref 0.0–0.7)
Eosinophils Relative: 1.3 % (ref 0.0–5.0)
HCT: 46.4 % (ref 39.0–52.0)
Hemoglobin: 15.6 g/dL (ref 13.0–17.0)
Lymphocytes Relative: 31.5 % (ref 12.0–46.0)
Lymphs Abs: 1.8 K/uL (ref 0.7–4.0)
MCHC: 33.7 g/dL (ref 30.0–36.0)
MCV: 94.8 fl (ref 78.0–100.0)
Monocytes Absolute: 0.6 K/uL (ref 0.1–1.0)
Monocytes Relative: 10 % (ref 3.0–12.0)
Neutro Abs: 3.1 K/uL (ref 1.4–7.7)
Neutrophils Relative %: 56.5 % (ref 43.0–77.0)
Platelets: 241 K/uL (ref 150.0–400.0)
RBC: 4.89 Mil/uL (ref 4.22–5.81)
RDW: 13 % (ref 11.5–15.5)
WBC: 5.6 K/uL (ref 4.0–10.5)

## 2023-11-23 LAB — VITAMIN D 25 HYDROXY (VIT D DEFICIENCY, FRACTURES): VITD: 36.75 ng/mL (ref 30.00–100.00)

## 2023-11-23 NOTE — Progress Notes (Signed)
 [facility] [address] [place], [place] [address] Office:  [phone]  -- Annual Preventive Medical Office Visit --  Patient:  Donald Montgomery      Age: 78 y.o.       Sex:  male  Date:   11/23/2023 Patient Care Team: Jesus Bernardino MATSU, MD as PCP - General (Internal Medicine) Watt Rush, MD as Attending Physician (Urology) Today's Healthcare Provider: Bernardino MATSU Jesus, MD  ========================================= Chief complaint: New Pt (Pt is present to est care with pcp no concerns pt states)  Purpose of Visit: Comprehensive preventive health assessment and personalized health maintenance planning.  This encounter was conducted as a Comprehensive Physical Exam (CPE) preventive care annual visit. The patient's medical history and problem list were reviewed to inform individualized preventive care recommendations.   No problem-specific medical treatment was provided during this visit.  Assessment & Plan Encounter for annual general medical examination with abnormal findings in adult The annual wellness visit revealed no acute medical issues, and he is in overall good health with no significant changes in medical history. An annual physical examination was performed, and blood work was ordered, including cholesterol, metabolic panel, blood count, thyroid  function, A1c, PSA, and vitamin D levels. Erectile dysfunction due to arterial insufficiency Testosterone  deficiency Managed by urology Sun-damaged skin History of skin cancer History of basal cell carcinoma and chronic skin changes due to sun exposure   Multiple basal cell carcinomas have been treated with Mohs surgery, and chronic sun damage is noted. The importance of sun protection and using hats instead of sunscreen was discussed. He should continue wearing hats for sun protection. Chronic allergic rhinitis Chronic allergic rhinitis with postnasal drip   Chronic throat clearing is likely due  to postnasal drip from allergic rhinitis, with no significant nasal inflammation or heartburn symptoms. Saline nasal mist was recommended as a non-prescription treatment, and the mechanism of postnasal drip and its relation to throat clearing was explained. Steroid spray was discussed as an option if sneezing occurs. He should use saline nasal mist daily, especially after outdoor exposure, and consider steroid spray if sneezing occurs. Cognitive complaints with normal exam ild cognitive symptoms, age-related   Mild cognitive symptoms are possibly age-related, with no significant sleep issues reported. Thyroid  function has not been recently checked, and the possibility of thyroid  issues contributing to cognitive symptoms was discussed. A thyroid  function test was ordered. Chronic kidney disease (CKD), stage 2 Stage 2 chronic kidney disease is age-related, with kidney function well-managed and within normal limits for age. Kidney function should continue to be monitored during annual physicals. Vitamin D deficiency Vitamin D supplementation (possible deficiency)   Vitamin D supplementation is ongoing, though no specific deficiency is diagnosed. The controversy around vitamin D supplementation and its potential benefits was discussed. He should continue vitamin D supplementation. Medication management Shared decision-making done; patient understood rationale and agreed to [facility]  Hyperglycemia Check Hemoglobin A1c. Strabismus Strabismus, corrected with lenses   Strabismus is corrected with corrective lenses, and no current issues with vision are reported. He should continue using corrective lenses as needed. Small bowel obstruction, partial (HCC) Intermittent small bowel obstruction, self-managed   Intermittent small bowel obstruction is managed with Coca Cola bolus, with no recent episodes. Potential dietary triggers and the role of high fiber and poorly chewed foods were discussed, along with the  evidence-based reason for Coca Cola's effectiveness in dissolving obstructions. A pill cam for further evaluation was discussed if symptoms recur. He should  continue self-management with Coca Cola bolus as needed, avoid high fiber and poorly chewed foods, and consider a GI referral if symptoms recur. Abdominal distension Suspicious or fluid vs truncal adiposity - he deferred ultrasound offer    ICD-10-CM   1. Erectile dysfunction due to arterial insufficiency  N52.01     2. Testosterone  deficiency  E34.9     3. History of skin cancer  Z85.828     4. Sun-damaged skin  L57.8     5. Chronic allergic rhinitis  J30.9     6. Cognitive complaints with normal exam  R41.9 TSH Rfx on Abnormal to Free T4    7. Chronic kidney disease (CKD), stage 2  N18.2 Comprehensive metabolic panel with GFR    CBC with Differential/Platelet    8. Vitamin D deficiency  E55.9 VITAMIN D 25 Hydroxy (Vit-D Deficiency, Fractures)    9. Medication management  Z79.899 Lipid panel    CBC with Differential/Platelet    TSH Rfx on Abnormal to Free T4    VITAMIN D 25 Hydroxy (Vit-D Deficiency, Fractures)    10. Hyperglycemia  R73.9 Hemoglobin A1c    11. Encounter for annual general medical examination with abnormal findings in adult  Z00.01 Lipid panel    Comprehensive metabolic panel with GFR    CBC with Differential/Platelet    TSH Rfx on Abnormal to Free T4    Hemoglobin A1c    VITAMIN D 25 Hydroxy (Vit-D Deficiency, Fractures)     Reviewed/updated/encouraged completion: Immunization History  Administered Date(s) Administered   Fluad Quad(high Dose 65+) 09/06/2018   INFLUENZA, HIGH DOSE SEASONAL PF 10/13/2013, 11/28/2014, 10/10/2015, 09/09/2016, 10/19/2017, 10/10/2023   Influenza Whole 10/10/2009   Influenza-Unspecified 10/13/2013, 11/28/2014, 10/10/2015, 09/09/2016, 10/19/2017   Moderna Sars-Covid-2 Vaccination 02/01/2019, 03/01/2019, 11/02/2019, 04/19/2020, 11/03/2020   Novel Infuenza-h1n1-09 01/03/2008    Pfizer(Comirnaty)Fall Seasonal Vaccine 12 years and older 01/08/2022, 10/06/2022, 11/18/2023   Pneumococcal Conjugate-13 02/26/2015   Pneumococcal Polysaccharide-23 12/01/2011   Tdap 01/09/2012, 03/13/2023   Zoster, Live 01/09/2012   Health Maintenance Due  Topic Date Due   Zoster Vaccines- Shingrix (1 of 2) 06[birth year]26/1964   Health Maintenance  Topic Date Due   Zoster Vaccines- Shingrix (1 of 2) 06/26/1963   Medicare Annual Wellness (AWV)  05/14/2024   COVID-19 Vaccine (9 - Moderna risk 2025-26 season) 05/17/2024   DTaP/Tdap/Td (3 - Td or Tdap) 03/12/2033   Pneumococcal Vaccine: 50+ Years  Completed   Influenza Vaccine  Completed   Hepatitis C Screening  Completed   Meningococcal B Vaccine  Aged Out   Colonoscopy  Discontinued    Reviewed the following verbally with patient and provided AVS materials:   HEALTH MAINTENANCE COUNSELING AND ANTICIPATORY GUIDANCE    Preventive Measure Recommendation  Eye Exams Every 1-2 years  Dental Care Cleanings every 6 months or more, brush/floss 3x daily  Sinus Care Saline spray rinses daily  Sleep 8 hours nightly, good sleep hygiene, e-monitoring if any daytime drowsiness  Diet Fruits/vegetables/fiber/healthy fats, balance and moderation  Exercise 150 minutes weekly  Risk Behaviors Discouraged any/all high risk behaviors    CANCER SCREENING SHARED DECISION MAKING    Prostate Individualized risks/benefits/costs discussed Left to urology Lab Results  Component Value Date   PSA 1.22 07/31/2022   PSA 0.98 10/15/2020   PSA 1.21 08/24/2019   PSA 0.74 11/27/2011   PSA 0.91 11/19/2006    Colon HM Colonoscopy          Completed or No Longer Recommended     Colonoscopy  Discontinued      Frequency changed to Never automatically (Topic No Longer Applies)   11/14/2008  Done - per patient report from [place];   Only the first 1 history entries have been loaded, but more history exists.              I would say no to additioinal  colon cancer screening  Lung Current guidelines recommend individuals aged 67 to 15 who currently smoke or formerly smoked and have a >= 20 pack-year smoking history should undergo annual screening with low-dose computed tomography (LDCT). Tobacco Use: Medium Risk (11/23/2023)   Patient History    Smoking Tobacco Use: Former    Smokeless Tobacco Use: Never    Passive Exposure: Not on file   Social History   Tobacco Use  Smoking Status Former   Current packs/day: 18.00   Average packs/day: 18.0 packs/day for 1 year (18.0 ttl pk-yrs)   Types: Cigarettes, Pipe  Smokeless Tobacco Never  Tobacco Comments   will consider AAA check as he states he smoked over 100 cigerattes     Skin Advised regular sunscreen use. Patient denies worrisome, changing, or new skin lesions. Offered to include images in chart for surveillance. Showed patient these pictures of melanomas for reference to educate for self-monitoring.  Other Cancers Discussed lack of screening guidelines and insurance coverage for other cancer types.    Discussed the use of AI scribe software for clinical note transcription with the patient, who gave verbal consent to proceed.  History of Present Illness 78 year old male who presents for an annual physical exam.  He experiences chronic throat clearing, which he attributes to postnasal drip. No chronic nasal inflammation, but he experiences sneezing, particularly when working on leaves. He uses saline nasal mist for symptom management and denies heartburn that could contribute to throat clearing.  He has a history of mild acid reflux, occurring infrequently and typically related to overeating. He does not take daily medication for reflux.  In 2021, he had a small bowel obstruction that resolved with conservative management during a hospital stay. He reports occasional similar symptoms since then, which he manages with Coca Cola, finding it effective in relieving symptoms.  He is under  the care of [facility] for erectile dysfunction, using AndroGel  TRT, and is satisfied with the management. He follows up every six months and has no plans for procedures.  He has a history of basal cell skin cancers, with recent excision of a lesion on his elbow. He has had multiple basal cell carcinomas removed from various locations, including his nose and scalp, and follows up with dermatology as needed.  He has a history of strabismus, which he manages with corrective lenses obtained approximately ten years ago, significantly improving his vision.  No significant heart or lung problems. He is in generally good health. He has a history of smoking but has quit for a long time.  He takes vitamin D supplements based on general health advice rather than specific deficiency symptoms.  ROS A comprehensive ROS was negative for any concerning symptoms.   Completed medication reconciliation: Current Outpatient Medications on File Prior to Visit  Medication Sig   ANDROGEL  PUMP 20.25 MG/ACT (1.62%) GEL Apply 4 application topically See admin instructions. Apply 2 pumps to each shoulder once a day   b complex vitamins tablet Take 1 tablet by mouth daily with supper.   Black Elderberry (SAMBUCUS ELDERBERRY PO) Take 1 tablet by mouth daily.    Cholecalciferol (VITAMIN  D-3) 25 MCG (1000 UT) CAPS Take 2,000 Units by mouth daily with supper.   Coenzyme Q10 (CO Q 10 PO) Take 1 capsule by mouth daily.   ibuprofen (ADVIL) 200 MG tablet Take 200 mg by mouth every 6 (six) hours as needed for headache or mild pain.   L-ARGININE PO Take 1 tablet by mouth daily with lunch.   MAGNESIUM PO Take 1 tablet by mouth daily with supper.   Omega-3 Fatty Acids (FISH OIL) 1000 MG CAPS Take 1,000 mg by mouth daily with supper.    Probiotic Product (ALIGN) 4 MG CAPS Take 4 mg by mouth daily.   tadalafil (CIALIS) 5 MG tablet Take 10-15 mg by mouth daily as needed for erectile dysfunction.    polyethylene glycol  (MIRALAX ) 17 g packet Take 17 g by mouth daily as needed for moderate constipation. (Patient not taking: Reported on 11/23/2023)   No current facility-administered medications on file prior to visit.  There are no discontinued medications.The following were reviewed and/or entered/updated into our electronic MEDICAL RECORD NUMBERPast Medical History:  Diagnosis Date   Cancer [facility])    Inguinal hernia, left 06/22/2013   Mild acid reflux 04/14/2016   SBO (small bowel obstruction) (HCC) 02/04/2019   Past Surgical History:  Procedure Laterality Date   TONSILLECTOMY     Social History   Socioeconomic History   Marital status: Married    Spouse name: Not on file   Number of children: 1   Years of education: Not on file   Highest education level: Not on file  Occupational History   Not on file  Tobacco Use   Smoking status: Former    Current packs/day: 18.00    Average packs/day: 18.0 packs/day for 1 year (18.0 ttl pk-yrs)    Types: Cigarettes, Pipe   Smokeless tobacco: Never   Tobacco comments:    will consider AAA check as he states he smoked over 100 cigerattes   Substance and Sexual Activity   Alcohol use: No   Drug use: No   Sexual activity: Not on file  Other Topics Concern   Not on file  Social History Narrative   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    Spends time with [employer] dtr    Social Drivers of Health   Financial Resource Strain: Low Risk  (05/15/2023)   Overall Financial Resource Strain (CARDIA)    Difficulty of Paying Living Expenses: Not hard at all  Food Insecurity: No Food Insecurity (05/15/2023)   Hunger Vital Sign    Worried About Running Out of Food in the Last Year: Never true    Ran Out of Food in the Last Year: Never true  Transportation Needs: No Transportation Needs (05/15/2023)   PRAPARE - Administrator, Civil Service (Medical): No    Lack of Transportation (Non-Medical): No  Physical Activity: Sufficiently Active  (05/15/2023)   Exercise Vital Sign    Days of Exercise per Week: 7 days    Minutes of Exercise per Session: 60 min  Stress: No Stress Concern Present (05/15/2023)   [employer] of Occupational Health - Occupational Stress Questionnaire    Feeling of Stress : Not at all  Social Connections: Socially Integrated (05/15/2023)   Social Connection and Isolation Panel    Frequency of Communication with Friends and Family: More than three times a week    Frequency of Social Gatherings with Friends and Family: More than three times a week  Attends Religious Services: More than 4 times per year    Active Member of Clubs or Organizations: Yes    Attends Banker Meetings: More than 4 times per year    Marital Status: Married  Catering Manager Violence: Not At Risk (05/15/2023)   Humiliation, Afraid, Rape, and Kick questionnaire    Fear of Current or Ex-Partner: No    Emotionally Abused: No    Physically Abused: No    Sexually Abused: No      05/15/2023   10:07 AM  Alcohol Use Disorder Test (AUDIT)  1. How often do you have a drink containing alcohol? 0  2. How many drinks containing alcohol do you have on a typical day when you are drinking? 0  3. How often do you have six or more drinks on one occasion? 0  AUDIT-C Score 0   Family History  Problem Relation Age of Onset   Heart disease Mother    Stroke Brother    Heart disease Brother    Allergic rhinitis Neg Hx    Angioedema Neg Hx    Asthma Neg Hx    Eczema Neg Hx    Immunodeficiency Neg Hx    Urticaria Neg Hx   No Known Allergies Social History   Substance and Sexual Activity  Sexual Activity Not on file  @    05/15/2023   10:07 AM  Depression screen PHQ 2/9  Decreased Interest 0  Down, Depressed, Hopeless 0  PHQ - 2 Score 0      05/15/2023   10:09 AM  Fall Risk   Falls in the past year? 1  Number falls in past yr: 0  Injury with Fall? 0  Risk for fall due to : No Fall Risks  Follow up Falls prevention  discussed;Falls evaluation completed     BP 100/60   Pulse 74   Temp 98.3 F (36.8 C) (Temporal)   Ht 5' 9.5 (1.765 m)   Wt 189 lb 3.2 oz (85.8 kg)   SpO2 98%   BMI 27.54 kg/m  BP Readings from Last 3 Encounters:  11/23/23 100/60  07/31/22 118/68  12/06/20 133/79   Wt Readings from Last 10 Encounters:  11/23/23 189 lb 3.2 oz (85.8 kg)  05/15/23 188 lb (85.3 kg)  07/31/22 188 lb 3.2 oz (85.4 kg)  11/22/21 183 lb (83 kg)  11/19/20 188 lb 12.8 oz (85.6 kg)  07/01/17 191 lb (86.6 kg)  07/01/17 191 lb 7 oz (86.8 kg)  01/12/17 191 lb 9.6 oz (86.9 kg)  04/14/16 189 lb 12.8 oz (86.1 kg)  03/04/16 189 lb 4 oz (85.8 kg)  Physical Exam  Physical Exam HEENT:  NECK: Thyroid  normal. CHEST: Lungs clear to auscultation bilaterally. ABDOMEN:  NEUROLOGICAL: Hearing intact bilaterally. Vision grossly intact.  GEN: No acute distress, resting comfortably. HEENT: Tympanic membranes normal appearing bilaterally, oropharynx clear, no thyromegaly noted, no palpable lymphadenopathy or thyroid  nodules.Nasal mucosa with minimal inflammation. Strabismus noted. CARDIOVASCULAR: S1 and S2 heart sounds with regular rate and rhythm, no murmurs appreciated. PULMONARY: Normal work of breathing, clear to auscultation bilaterally, no crackles, wheezes, or rhonchi. ABDOMEN: Soft, nontender,Abdomen with fluid retention. MSK: No edema, cyanosis, or clubbing noted. SKIN: Warm, dry, no lesions of concern observed. NEUROLOGICAL: Cranial nerves II-XII grossly intact, strength 5/5 in upper and lower extremities, reflexes symmetric and intact bilaterally. PSYCH: Normal affect and thought content, pleasant and cooperative.  ======================================  IMPORTANT HEALTH REMINDERS: Report any new or changing skin lesions promptly Maintain  recommended screening schedules Discuss any new family history of cancer at future visits Follow up on any new symptoms that persist more than two weeks       Notes:  This document was synthesized by artificial intelligence (Abridge) using HIPAA-compliant recording of the clinical interaction;   We discussed the use of AI scribe software for clinical note transcription with the patient, who gave verbal consent to proceed.    This encounter employed state-of-the-art, real-time, collaborative documentation. The patient was empowered to actively review and assist in updating their electronic medical record on a shared monitor, ensuring transparency and improving accuracy.    Prior to and at the beginning of Comprehensive Physical Exam (CPE) preventive care annual visit appointment types  we clarify to patients Our goal today is to focus on your preventive or annual Comprehensive Physical Exam (CPE) preventive care annual visit, which typically covers routine screenings and overall health maintenance. However, if you share any new or concerning symptoms--such as dizziness, passing out, severe pain, or anything else that may point to a more serious issue--we are both legally and ethically required to evaluate it. We cannot simply overlook or ignore such concerns, even if you later decide you don't want to discuss them, because it could jeopardize your health.  If addressing a new concern takes us  beyond the scope of the preventive visit, we may need to bill separately for that portion of care. We understand financial considerations are important, and we're happy to discuss your options if something new comes up. However, we want to be clear that once you mention a potentially serious issue, we must investigate it; we can't ethically or legally exclude that from our records or our evaluation. Please let us  know all of your questions or worries. Together, we can decide how best to manage them and how to minimize any unexpected costs, but we want to keep you safe above all else.   This disclosure is mandated by professional ethics and legal obligations, as  healthcare providers must address any substantial health concerns raised during any patient interaction and a comprehensive ROS is required by insurance companies for billing preventive-care visit type.   This disclosure ultimately discourages patients financially from reporting significant health issues.   Medical Screening Exam A medical screening exam (MSE) helps to determine whether you need immediate medical treatment relating to any number of symptoms you are having. This type of exam may be done in an emergency department, an urgent care setting, or your health care provider's office. Depending on your symptoms and severity, you may need additional tests or medical therapy. It is important to note that an MSE does not necessarily mean that you will need or receive further medical testing or interventions if your symptoms are not deemed to be medically urgent (emergent). Tell a health care provider about: Any allergies you have. All medicines you are taking, including vitamins, herbs, eye drops, creams, and over-the-counter medicines. Any problems you or family members have had with anesthetic medicines. Any bleeding problems you have. Any surgeries you have had. Any medical conditions you have. Whether you are pregnant or may be pregnant. What happens during the test? During the exam, a health care provider does a short, often focused, physical exam and asks about your medical history to assess: Your current symptoms. Your overall health. Your need for possible further medical intervention. What can I expect after the test? If you have a regular health care provider, make an appointment for a follow-up visit  with him or her. If you do not have a regular health care provider, ask about resources in your community. Your medical screening exam may determine that: You do not need emergency treatment at this time. You need treatment right away. You need to be transferred to another medical  center. This may happen if you need an emergent specialist or consultant that is not available at the medical center you are at. You need to have more tests. A medical specialist may be consulted if needed. Get help right away if: Your condition gets worse. You develop new or troubling symptoms before you see your health care provider. These symptoms may represent a serious problem that is an emergency. Do not wait to see if the symptoms will go away. Get medical help right away. Call your local emergency services (911 in the [place] Do not drive yourself to the hospital. Summary A medical screening exam helps to determine whether you need medical treatment right away. This type of exam may be done in an emergency department, an urgent care setting, or your health care provider's office. During the exam, a health care provider does a short physical exam and asks about your current symptoms and overall health. Depending on the exam, more tests or therapies may be ordered. However, an MSE does not necessarily mean that you will have further medical testing if your symptoms are not deemed to be urgent. If you need further care that is not offered at your current medical center, you may need to be transferred to another facility. This information is not intended to replace advice given to you by your health care provider. Make sure you discuss any questions you have with your health care provider. Document Revised: 09/05/2020 Document Reviewed: 05/03/2020 Elsevier Patient Education  2024 Elsevier Inc.   Health Maintenance, Male Adopting a healthy lifestyle and getting preventive care are important in promoting health and wellness. Ask your health care provider about: The right schedule for you to have regular tests and exams. Things you can do on your own to prevent diseases and keep yourself healthy. What should I know about diet, weight, and exercise? Eat a healthy diet  Eat a diet that  includes plenty of vegetables, fruits, low-fat dairy products, and lean protein. Do not eat a lot of foods that are high in solid fats, added sugars, or sodium. Maintain a healthy weight Body mass index (BMI) is a measurement that can be used to identify possible weight problems. It estimates body fat based on height and weight. Your health care provider can help determine your BMI and help you achieve or maintain a healthy weight. Get regular exercise Get regular exercise. This is one of the most important things you can do for your health. Most adults should: Exercise for at least 150 minutes each week. The exercise should increase your heart rate and make you sweat (moderate-intensity exercise). Do strengthening exercises at least twice a week. This is in addition to the moderate-intensity exercise. Spend less time sitting. Even light physical activity can be beneficial. Watch cholesterol and blood lipids Have your blood tested for lipids and cholesterol at 79 years of age, then have this test every 5 years. You may need to have your cholesterol levels checked more often if: Your lipid or cholesterol levels are high. You are older than 79 years of age. You are at high risk for heart disease. What should I know about cancer screening? Many types of cancers can be detected early and  may often be prevented. Depending on your health history and family history, you may need to have cancer screening at various ages. This may include screening for: Colorectal cancer. Prostate cancer. Skin cancer. Lung cancer. What should I know about heart disease, diabetes, and high blood pressure? Blood pressure and heart disease High blood pressure causes heart disease and increases the risk of stroke. This is more likely to develop in people who have high blood pressure readings or are overweight. Talk with your health care provider about your target blood pressure readings. Have your blood pressure  checked: Every 3-5 years if you are 84-82 years of age. Every year if you are 47 years old or older. If you are between the ages of 60 and 54 and are a current or former smoker, ask your health care provider if you should have a one-time screening for abdominal aortic aneurysm (AAA). Diabetes Have regular diabetes screenings. This checks your fasting blood sugar level. Have the screening done: Once every three years after age 57 if you are at a normal weight and have a low risk for diabetes. More often and at a younger age if you are overweight or have a high risk for diabetes. What should I know about preventing infection? Hepatitis B If you have a higher risk for hepatitis B, you should be screened for this virus. Talk with your health care provider to find out if you are at risk for hepatitis B infection. Hepatitis C Blood testing is recommended for: Everyone born from [birth year] through 1966. Anyone with known risk factors for hepatitis C. Sexually transmitted infections (STIs) You should be screened each year for STIs, including gonorrhea and chlamydia, if: You are sexually active and are younger than 79 years of age. You are older than 79 years of age and your health care provider tells you that you are at risk for this type of infection. Your sexual activity has changed since you were last screened, and you are at increased risk for chlamydia or gonorrhea. Ask your health care provider if you are at risk. Ask your health care provider about whether you are at high risk for HIV. Your health care provider may recommend a prescription medicine to help prevent HIV infection. If you choose to take medicine to prevent HIV, you should first get tested for HIV. You should then be tested every 3 months for as long as you are taking the medicine. Follow these instructions at home: Alcohol use Do not drink alcohol if your health care provider tells you not to drink. If you drink alcohol: Limit how  much you have to 0-2 drinks a day. Know how much alcohol is in your drink. In the [place] one drink equals one 12 oz bottle of beer (355 mL), one 5 oz glass of wine (148 mL), or one 1 oz glass of hard liquor (44 mL). Lifestyle Do not use any products that contain nicotine or tobacco. These products include cigarettes, chewing tobacco, and vaping devices, such as e-cigarettes. If you need help quitting, ask your health care provider. Do not use street drugs. Do not share needles. Ask your health care provider for help if you need support or information about quitting drugs. General instructions Schedule regular health, dental, and eye exams. Stay current with your vaccines. Tell your health care provider if: You often feel depressed. You have ever been abused or do not feel safe at home. Summary Adopting a healthy lifestyle and getting preventive care are important in promoting  health and wellness. Follow your health care provider's instructions about healthy diet, exercising, and getting tested or screened for diseases. Follow your health care provider's instructions on monitoring your cholesterol and blood pressure. This information is not intended to replace advice given to you by your health care provider. Make sure you discuss any questions you have with your health care provider. Document Revised: 05/14/2020 Document Reviewed: 05/14/2020 Elsevier Patient Education  2024 [employer].

## 2023-11-23 NOTE — Assessment & Plan Note (Signed)
 Intermittent small bowel obstruction, self-managed   Intermittent small bowel obstruction is managed with Coca Cola bolus, with no recent episodes. Potential dietary triggers and the role of high fiber and poorly chewed foods were discussed, along with the evidence-based reason for Coca Cola's effectiveness in dissolving obstructions. A pill cam for further evaluation was discussed if symptoms recur. He should continue self-management with Coca Cola bolus as needed, avoid high fiber and poorly chewed foods, and consider a GI referral if symptoms recur.

## 2023-11-23 NOTE — Assessment & Plan Note (Signed)
 Strabismus, corrected with lenses   Strabismus is corrected with corrective lenses, and no current issues with vision are reported. He should continue using corrective lenses as needed.

## 2023-11-23 NOTE — Assessment & Plan Note (Signed)
 History of basal cell carcinoma and chronic skin changes due to sun exposure   Multiple basal cell carcinomas have been treated with Mohs surgery, and chronic sun damage is noted. The importance of sun protection and using hats instead of sunscreen was discussed. He should continue wearing hats for sun protection.

## 2023-11-23 NOTE — Patient Instructions (Addendum)
 VISIT SUMMARY: You had your annual physical exam today, and overall, you are in good health with no significant changes in your medical history. We discussed several ongoing health issues and made plans for their management. Blood work was ordered to check various health markers, including cholesterol, metabolic panel, blood count, thyroid  function, A1c, PSA, and vitamin D levels.  YOUR PLAN: -CHRONIC ALLERGIC RHINITIS WITH POSTNASAL DRIP: Your chronic throat clearing is likely due to postnasal drip from allergic rhinitis. This means your nasal passages are producing excess mucus, which drips down your throat. You should continue using saline nasal mist daily, especially after being outdoors, and consider using a steroid spray if you experience sneezing.  -INTERMITTENT SMALL BOWEL OBSTRUCTION, SELF-MANAGED: You have a history of small bowel obstructions, which you manage with Coca Cola. This condition involves a blockage in your small intestine. Continue using Coca Cola as needed, avoid high fiber and poorly chewed foods, and consider seeing a gastrointestinal specialist if symptoms recur.  -HISTORY OF BASAL CELL CARCINOMA AND CHRONIC SKIN CHANGES DUE TO SUN EXPOSURE: You have had multiple basal cell carcinomas removed, which are a type of skin cancer caused by sun exposure. Continue protecting your skin by wearing hats when you are outside.  -CHRONIC KIDNEY DISEASE, STAGE 2 (AGE-RELATED): You have stage 2 chronic kidney disease, which is common as people age. Your kidney function is currently well-managed. We will continue to monitor your kidney function during your annual physicals.  -VITAMIN D SUPPLEMENTATION (POSSIBLE DEFICIENCY): You are taking vitamin D supplements, although you do not have a diagnosed deficiency. Vitamin D is important for bone health and overall well-being. Continue taking your vitamin D supplements.  -STRABISMUS, CORRECTED WITH LENSES: Your strabismus, a condition where the  eyes do not properly align, is corrected with lenses. Continue using your corrective lenses as needed.  -MILD COGNITIVE SYMPTOMS, AGE-RELATED: You have mild cognitive symptoms that may be related to aging. We have ordered a thyroid  function test to rule out any thyroid  issues that could be contributing to these symptoms.  INSTRUCTIONS: Please follow up with the blood work we ordered, including tests for cholesterol, metabolic panel, blood count, thyroid  function, A1c, PSA, and vitamin D levels. If your symptoms of small bowel obstruction recur, consider a referral to a gastrointestinal specialist. Continue with your current management plans for your other conditions, and use the saline nasal mist daily for your postnasal drip.    Managing Your Health Over Time  Managing every aspect of your health in a single visit isn't always feasible, but that's okay.  We addressed many preventive issues today and charted a course for future care. Acute conditions or preventive care measures may require further attention.  We encourage you to schedule a follow-up visit at your earliest convenience to discuss any unresolved issues.  We strongly encourage continued participation in annual preventive care visits to help us  develop a more thorough understanding of your health and to help you maintain optimal wellness - please inquire about scheduling your next one with us  at your earliest convenience.  Next Steps  [x]   Schedule Follow-Up:  We recommend a follow-up appointment in 1 year for your next wellness visit.  If you develop any new problems, want to address any medical issues, or your condition worsens before then, please call us  for an appointment or seek emergency care. [x]   Preventive Care:  Make sure to keep regular appointments with dental and vision professionals, use nightly nasal saline mist sprays to keep your sinuses  clear and toothbrushing to protect your teeth. Use [employer] App or other app to track  your sleep quality. Check blood pressure and heart rate routinely. [x]   Medical Information Release:  For any relevant medical information we don't have, please sign a release form at the front desk so we can obtain it for your records. [x]   Lab Tests:  Schedule any lab tests from today for within a week to ensure best insurance coverage.    Making the Most of Our Focused (20 minute) Appointments:  [x]   Clearly state your top concerns at the beginning of the visit to focus our discussion [x]   If you anticipate you will need more time, please inform the front desk during scheduling - we can book multiple appointments in the same week. [x]   If you have transportation problems- use our convenient video appointments or ask about transportation support. [x]   We can get down to business faster if you use MyChart to update information before the visit and submit non-urgent questions before your visit. Thank you for taking the time to provide details through MyChart.  Let our nurse know and she can import this information into your encounter documents.  Arrival and Wait Times: [x]   Arriving on time ensures that everyone receives prompt attention. [x]   Early morning (8a) and afternoon (1p) appointments tend to have shortest wait times. [x]   Unfortunately, we cannot delay appointments for late arrivals or hold slots during phone calls.  Bring to Your Next Appointment  [x]   Medications: Please bring all your medication bottles to your next appointment to ensure we have an accurate record of your prescriptions. [x]   Health Diaries: If you're monitoring any health conditions at home, keeping a diary of your readings can be very helpful for discussions at your next appointment.  Reviewing Your Records  [x]   Review your attached preventive care information at the end of these patient instructions. [x]   Review this early draft of your clinical encounter notes below and the final encounter summary tomorrow on  MyChart after its been completed.   Encounter for annual general medical examination with abnormal findings in adult -     Lipid panel -     Comprehensive metabolic panel with GFR -     CBC with Differential/Platelet -     TSH Rfx on Abnormal to Free T4 -     Hemoglobin A1c -     VITAMIN D 25 Hydroxy (Vit-D Deficiency, Fractures)  Erectile dysfunction due to arterial insufficiency  Testosterone  deficiency  History of skin cancer  Sun-damaged skin  Chronic allergic rhinitis  Cognitive complaints with normal exam -     TSH Rfx on Abnormal to Free T4  Chronic kidney disease (CKD), stage 2 -     Comprehensive metabolic panel with GFR -     CBC with Differential/Platelet  Vitamin D deficiency -     VITAMIN D 25 Hydroxy (Vit-D Deficiency, Fractures)  Medication management -     Lipid panel -     CBC with Differential/Platelet -     TSH Rfx on Abnormal to Free T4 -     VITAMIN D 25 Hydroxy (Vit-D Deficiency, Fractures)  Hyperglycemia -     Hemoglobin A1c  Strabismus     Getting Answers and Following Up  [x]   Simple Questions & Concerns: For quick questions or basic follow-up after your visit, reach us  at [phone] or MyChart messaging. [x]   Complex Concerns: If your concern is more complex, scheduling  an appointment might be best. Discuss this with the staff to find the most suitable option. [x]   Lab & Imaging Results: We'll contact you directly if results are abnormal or you don't use MyChart. Most normal results will be on MyChart within 2-3 business days, with a review message from Dr. Jesus. Haven't heard back in 2 weeks? Need results sooner? Contact us  at [phone]. [x]   Referrals: Our referral coordinator will manage specialist referrals. The specialist's office should contact you within 2 weeks to schedule an appointment. Call us  if you haven't heard from them after 2 weeks.  Staying Connected  [x]   MyChart: Activate your MyChart for the fastest way to  access results and message us . See the last page of this paperwork for instructions on how to activate.  Billing  [x]   X-ray & Lab Orders: These are billed by separate companies. Contact the invoicing company directly for questions or concerns. [x]   Visit Charges: Discuss any billing inquiries with our administrative services team.  Your Satisfaction Matters  [x]   Share Your Experience: We strive for your satisfaction! If you have any complaints, or preferably compliments, please let Dr. Jesus know directly or contact our Practice Administrators, Manuelita Rubin or [employer], by asking at the front desk.   Medical Screening Exam A medical screening exam (MSE) helps to determine whether you need immediate medical treatment relating to any number of symptoms you are having. This type of exam may be done in an emergency department, an urgent care setting, or your health care provider's office. Depending on your symptoms and severity, you may need additional tests or medical therapy. It is important to note that an MSE does not necessarily mean that you will need or receive further medical testing or interventions if your symptoms are not deemed to be medically urgent (emergent). Tell a health care provider about: Any allergies you have. All medicines you are taking, including vitamins, herbs, eye drops, creams, and over-the-counter medicines. Any problems you or family members have had with anesthetic medicines. Any bleeding problems you have. Any surgeries you have had. Any medical conditions you have. Whether you are pregnant or may be pregnant. What happens during the test? During the exam, a health care provider does a short, often focused, physical exam and asks about your medical history to assess: Your current symptoms. Your overall health. Your need for possible further medical intervention. What can I expect after the test? If you have a regular health care provider, make an  appointment for a follow-up visit with him or her. If you do not have a regular health care provider, ask about resources in your community. Your medical screening exam may determine that: You do not need emergency treatment at this time. You need treatment right away. You need to be transferred to another medical center. This may happen if you need an emergent specialist or consultant that is not available at the medical center you are at. You need to have more tests. A medical specialist may be consulted if needed. Get help right away if: Your condition gets worse. You develop new or troubling symptoms before you see your health care provider. These symptoms may represent a serious problem that is an emergency. Do not wait to see if the symptoms will go away. Get medical help right away. Call your local emergency services (911 in the [place] Do not drive yourself to the hospital. Summary A medical screening exam helps to determine whether you need medical treatment right  away. This type of exam may be done in an emergency department, an urgent care setting, or your health care provider's office. During the exam, a health care provider does a short physical exam and asks about your current symptoms and overall health. Depending on the exam, more tests or therapies may be ordered. However, an MSE does not necessarily mean that you will have further medical testing if your symptoms are not deemed to be urgent. If you need further care that is not offered at your current medical center, you may need to be transferred to another facility. This information is not intended to replace advice given to you by your health care provider. Make sure you discuss any questions you have with your health care provider. Document Revised: 09/05/2020 Document Reviewed: 05/03/2020 Elsevier Patient Education  2024 [employer] Inc.     ICD-10-CM   1. Erectile dysfunction due to arterial insufficiency  N52.01     2.  Testosterone  deficiency  E34.9     3. History of skin cancer  Z85.828     4. Sun-damaged skin  L57.8     5. Chronic allergic rhinitis  J30.9     6. Cognitive complaints with normal exam  R41.9 TSH Rfx on Abnormal to Free T4    7. Chronic kidney disease (CKD), stage 2  N18.2 Comprehensive metabolic panel with GFR    CBC with Differential/Platelet    8. Vitamin D deficiency  E55.9 VITAMIN D 25 Hydroxy (Vit-D Deficiency, Fractures)    9. Medication management  Z79.899 Lipid panel    CBC with Differential/Platelet    TSH Rfx on Abnormal to Free T4    VITAMIN D 25 Hydroxy (Vit-D Deficiency, Fractures)    10. Hyperglycemia  R73.9 Hemoglobin A1c    11. Encounter for annual general medical examination with abnormal findings in adult  Z00.01 Lipid panel    Comprehensive metabolic panel with GFR    CBC with Differential/Platelet    TSH Rfx on Abnormal to Free T4    Hemoglobin A1c    VITAMIN D 25 Hydroxy (Vit-D Deficiency, Fractures)        ALLERGY MANAGEMENT PLAN  This plan is designed to help manage your allergic rhinitis (nasal allergies) effectively. Follow these steps daily for best results.  Sinus saline sprays- use nightly, and after sneezing episodes or exposure to allergen.  Insert deeply and spray mist into nose while leaning over sink at 45 degrees,  while gently breathing. Also blow out onto tissue while leaning forward 45 degrees. Once daily, after a sinus rinse, use sensimist.  Just before bedtime is best. This only needed if allergies acting up.  If this is inadequate add-on once daily for levocetirizine / xyzal 5 mg for nondrowsy antihistamine Take benadryl 25 mg at bedtime also if allergic mucus is persisting  When allergies cause chronic swelling in sinuses, it leads to sinus infections:    DAILY TREATMENT ROUTINE   Time of Day Treatment Steps  Morning 1. Saline Nasal Spray - Use to cleanse nasal passages 2. Xyzal (levocetirizine) - Take one tablet daily    Throughout Day Saline Nasal Spray - Use 2 additional times (mid-day and afternoon)   Evening/Bedtime 1. Saline Nasal Rinse - Thoroughly clean nasal passages 2. Flonase  Sensimist - Apply after nasal rinse 3. Benadryl (diphenhydramine) - Take 25mg  if experiencing persistent congestion    PROPER TECHNIQUE GUIDE       Saline Nasal Spray/Rinse Technique: Lean forward over sink at a 45-degree angle Turn  head slightly to one side Insert spray tip into upper nostril Spray gently while breathing lightly through your nose Repeat on other side Gently blow nose to clear excess solution Use saline spray 3 times daily to keep nasal passages moist and clear allergens.       Flonase  Sensimist Technique: Shake bottle gently before each use Prime the bottle if it's new or hasn't been used for a week Tilt your head forward slightly Insert tip into nostril, pointing away from the center of your nose Spray while inhaling gently Repeat in other nostril Use Flonase  Sensimist once daily, preferably at bedtime after using saline rinse. It may take several days of regular use to feel maximum benefit.   WHY FLONASE  SENSIMIST?   Benefits of Flonase  Sensimist:  Alcohol-free and scent-free formula - gentler on sensitive nasal passages Fine mist application - more comfortable with less dripping down throat Effectively relieves nasal congestion, sneezing, runny nose, and even eye symptoms 24-hour relief with once-daily dosing Uses a more potent form of fluticasone  that works at a lower dose Less liquid per spray means less discomfort  UNDERSTANDING YOUR MEDICATIONS   Medication How It Works Important Notes  Flonase  Sensimist (fluticasone  furoate) Reduces inflammation in nasal passages, addressing the underlying cause of allergy symptoms - Takes several days for full effect - Use daily for best results - Safe for long-term use   Xyzal (levocetirizine) Blocks histamine to reduce allergy symptoms like  sneezing and itching - Take at the same time each day - May cause drowsiness in some people - Once-daily dosing   Benadryl (diphenhydramine) Antihistamine that provides additional relief for breakthrough symptoms - Causes drowsiness - Use only at bedtime - For occasional use when needed   Saline Spray/Rinse Physically removes allergens and moistens nasal passages - Safe to use frequently - Improves effectiveness of other treatments - Reduces nasal irritation    CONTACT YOUR PROVIDER IF: Your symptoms do not improve after 1-2 weeks of following this plan You develop sinus pain with fever or green/yellow discharge You experience frequent nosebleeds You develop new or worsening symptoms You have questions about your treatment plan     ADDITIONAL ALLERGY MANAGEMENT TIPS   HELPFUL STRATEGIES: ?? Keep windows closed during high pollen seasons ??? Use allergen-proof covers for pillows and mattresses ?? Vacuum regularly with a HEPA filter vacuum ?? Shower and change clothes after spending time outdoors ?? Check local pollen counts and limit outdoor time when counts are high ?? Stay well-hydrated to help keep mucous membranes moist        Coca-Cola is not a homeopathic remedy, but its use in small bowel obstruction (SBO) is based on its chemical properties, particularly its acidity and carbonation, which can help dissolve certain types of obstructing material such as bezoars or enteroliths.  Several case reports and systematic reviews document the use of Coca-Cola for dissolving gastric and small bowel bezoars, especially phytobezoars and enteroliths. The proposed mechanisms include the beverage's low pH (high acidity), which can chemically break down the bezoar or enterolith, and its carbonation, which may help fragment the mass and promote passage.[1][2][3][4][5][6][7] For example, in one case, cola administered via an ileus tube dissolved a calcium phosphate enterolith, likely through an  acid-base reaction.[1] Systematic reviews show that Coca-Cola can dissolve gastric phytobezoars in about half of cases and, when combined with endoscopic techniques, is effective in over 90% of cases.[3]  There is no evidence that Coca-Cola works by any homeopathic principle. Homeopathy involves extreme dilutions of  substances, often to the point where no molecules of the original substance remain, and relies on the concept of like cures like, which is not relevant here. The meta-analysis on homeopathy for postoperative ileus does not pertain to Coca-Cola or its mechanism.[8]  In summary, Coca-Cola may relieve SBO symptoms in select cases by chemically dissolving obstructing masses, not by homeopathic action, but by direct physicochemical effects. This approach is best documented for bezoar- or enterolith-related obstructions and is not a standard therapy for adhesive or other causes of SBO.[1][2][3][4][5][6][7]   Would you like me to review the safety profile and potential risks associated with using Coca-Cola for small bowel obstruction, including any reported adverse events or contraindications in the medical literature? References Cola Dissolution Therapy via Ileus Tube Was Effective for Ileus Secondary to Small Bowel Obstruction Induced by an Enterolith. Mamie CINDERELLA Sammye GORMAN Carlena DELENA, et al. Internal Medicine Council, [place]). 2019;58(17):2473-2478. doi:10.2169/internalmedicine.7254-80. Obstructive Bezoars of the Small Bowel Treated With Coca-Cola Zero Through a Long Intestinal Tube and Endoscopic Manipulation. Endo K, Kakisaka K, Suzuki Y, Matsumoto T, Takikawa Y. Internal Medicine ([address], [place]). 2017;56(22):3019-3022. doi:10.2169/internalmedicine.1077-82. Systematic Review: Coca-Cola Can Effectively Dissolve Gastric Phytobezoars as a First-Line Treatment. Ladas SD, Kamberoglou D, Karamanolis G, Vlachogiannakos J, Zouboulis-Vafiadis I. Alimentary Pharmacology & Therapeutics. 2013;37(2):169-73.  doi:10.1111/apt.12141. Tannin-Phytobezoars With Gastric Outlet Obstruction Treated by Dissolution With Administration and Endoscopic Injection of Coca-Cola, Endoscopic Crushing, and Removal (With Video). Ota K, Kawaguchi S, Iwatsubo T, et al. Internal Medicine [place], [place]). 2022;61(3):335-338. doi:10.2169/internalmedicine.Q1871868. How Good Is Cola for Dissolution of Gastric Phytobezoars?SABRA Ruth BJ, Park RENETTE Dauphin HJ, et al. World Journal of Gastroenterology. 2009;15(18):2265-9. doi:10.3748/wjg.15.2265. Diabetic Gastroparesis-Associated Bezoar Resolution via Cola-Lysis. Whitson BA, Asolati M, [place]. Clinical Transplantation. 2008 Mar-Apr;22(2):242-4. doi:10.1111/j.1399-0012.2007.00763.x. Gastric Phytobezoars May Be Treated by Nasogastric Coca-Cola Lavage. Ladas SD, Triantafyllou K, Tzathas C, et al. European Journal of Gastroenterology & Hepatology. 2002;14(7):801-3. doi:10.1097/[id number]. Homeopathy for Postoperative Ileus? A Meta-Analysis. Gwenn JINNY Dara BILLI Howell CHARLENA Journal of Clinical Gastroenterology. 1997;25(4):628-33. doi:10.1097/[id number].   Milder episodes of small bowel obstruction in this patient likely represent intermittent or low-grade obstruction, which can present with nonspecific symptoms and may be challenging to diagnose with standard imaging; provocative or dynamic studies may be needed for further evaluation.[1]  The apparent relief provided by Coca-Cola suggests that the obstruction may be due to a dissolvable mass, such as a bezoar or enterolith, since Coca-Cola's acidic properties have documented efficacy in dissolving these materials and resolving obstruction in select cases without surgery.[2][3][4][5] This pattern of symptom relief is less consistent with adhesive or fixed causes of obstruction, which typically do not respond to chemical dissolution.  However, recurrence of even mild obstructive symptoms indicates ongoing  pathology and a risk for future, potentially more severe episodes. While nonoperative management may be effective in the short term, recurrent episodes are associated with an increased risk of subsequent, possibly more serious obstructions, and the interval between episodes may decrease over time.[6][7] This underscores the importance of ongoing monitoring and consideration of long-term management strategies.  In summary, these milder symptoms and their resolution with Coca-Cola point toward a potentially dissolvable cause of obstruction, but do not exclude chronic or recurrent disease, and warrant further evaluation and discussion of long-term management options. The medical literature does not specifically address whether further diagnostic workup is indicated in this scenario, so individualized assessment is recommended.   Would you like me to review the literature on the diagnostic accuracy and utility of advanced imaging modalities--such as CT enterography or MR enterography--for identifying  bezoars or other intermittent causes of small bowel obstruction in patients with recurrent symptoms? References ACR Appropriateness Criteria Suspected Small-Bowel Obstruction. Expert Panel on Gastrointestinal Imaging, Tina GRASS, Oscar BIRCH, et al. Journal of the [employer] of Radiology : [place]. 2020;17(5S):S305-S314. doi:10.1016/j.jacr.2020.01.025. Cola Dissolution Therapy via Ileus Tube Was Effective for Ileus Secondary to Small Bowel Obstruction Induced by an Enterolith. Mamie CINDERELLA Sammye GORMAN Carlena DELENA, et al. Internal Medicine [place], [place]). 2019;58(17):2473-2478. doi:10.2169/internalmedicine.7254-80. Obstructive Bezoars of the Small Bowel Treated With Coca-Cola Zero Through a Long Intestinal Tube and Endoscopic Manipulation. Endo K, Kakisaka K, Suzuki Y, Matsumoto T, Takikawa Y. Internal Medicine ([address], [place]). 2017;56(22):3019-3022. doi:10.2169/internalmedicine.1077-82. Systematic Review: Coca-Cola Can  Effectively Dissolve Gastric Phytobezoars as a First-Line Treatment. Ladas SD, Kamberoglou D, Karamanolis G, Vlachogiannakos J, Zouboulis-Vafiadis I. Alimentary Pharmacology & Therapeutics. 2013;37(2):169-73. doi:10.1111/apt.12141. How Good Is Cola for Dissolution of Gastric Phytobezoars?SABRA Ruth BJ, Park RENETTE Dauphin HJ, et al. World Journal of Gastroenterology. 2009;15(18):2265-9. doi:10.3748/wjg.15.2265. Gastrointestinal Surgical Emergencies Textbook. Rosina DRAFTS Beverley Alfonso Maebelle Arla Bernhard, et al. [employer] [phone]). Association of Surgical Intervention for Adhesive Small-Bowel Obstruction With the Risk of Recurrence. Bunny JONELLE Kathline BELVIA Jonette GORMAN, et al. JAMA Surgery. 2019;154(5):413-420. doi:10.1001/jamasurg.7981.4751.    Capsule endoscopy can be useful for diagnosing the cause of recurrent small bowel obstruction, but it carries a significant risk of capsule retention in patients with suspected strictures or obstructive symptoms.[1][2][3]  The [employer] of Gastroenterology and other expert reviews recommend that patients with obstructive symptoms should undergo small bowel imaging and/or patency capsule testing before capsule endoscopy to minimize the risk of capsule retention.[4][5][6] Capsule retention is the most feared complication and, while often manageable, may require surgical intervention in some cases.[3][7][8] Retention is more likely in patients with a history of obstruction, Crohn's disease, or known/suspected strictures.[2][3][5]  If small bowel imaging (such as CT or MR enterography) or a patency capsule confirms that the bowel is passable, then capsule endoscopy may be considered. If patency cannot be confirmed, capsule endoscopy should be avoided due to the risk of retention and possible worsening of obstruction.[4][5][6]  There is no evidence that relief with [employer] boluses changes the safety profile or decision-making regarding capsule endoscopy. The  key consideration remains the risk of underlying strictures or incomplete patency, not the response to home or alternative remedies.  In summary, capsule endoscopy should only be considered in patients with recurrent small bowel obstruction after confirming small bowel patency with imaging or a patency capsule; otherwise, the risk of retention outweighs the potential diagnostic benefit.[4][5][6]   Would you like me to review the latest evidence on the diagnostic accuracy and safety of patency capsule testing specifically in patients with recurrent small bowel obstruction, to help guide your pre-capsule endoscopy workup? References ACG Clinical Guideline: Diagnosis and Management of Small Bowel Bleeding. Pegge PEDLAR, Fidler JL, Bidwell DR, Rebbeca SHILLING. The American Journal of Gastroenterology. 2015;110(9):1265-87; quiz 1288. doi:10.1038/ajg.2015.246. Retention of Small Bowel Capsule Endoscopy. Rosa B, Dray X, Koulaouzidis A. Current Opinion in Gastroenterology. 2023;39(3):227-233. doi:10.1097/MOG.0000000000000921. From Lens to Scalpel: A Systematic Literature Review of Intestinal Obstruction After Video Capsule Endoscopy. Mylonakis A, Sakarellos P, Gkolfakis P, et al. Digestive Diseases ([address], [place]). 2025;43(3):368-377. doi:10.1159/000544891. ACG Clinical Guideline: Management of Crohn's Disease in Adults. Lichtenstein GR, Loftus EV, Afzali A, et al. [employer] of Gastroenterology. 2025;120(6):1225-1264. doi:10.14309/ajg.0000000000003465. Tag-Less Patency Capsule for Suspected Small Bowel Stenosis: Museum/gallery Exhibitions Officer Prospective Study in [place]. Velvet HERO, Rosendo MARLA Glenys LOISE, et al. Digestive Endoscopy : Official Journal of the [place] Gastroenterological Endoscopy  Society. 2021;33(1):151-161. doi:10.1111/den.Y4569323. Contraindications for Video Capsule Endoscopy. Bandorski D, Kurniawan N, Baltes P, et al. World Journal of Gastroenterology. 2016;22(45):9898-9908.  doi:10.3748/wjg.(747)651-1839. Diagnostic Effect of Capsule Endoscopy in 31 Cases of Subacute Small Bowel Obstruction. Diona FUELLING, Laurence JONES, Zhang BL, et al. World Journal of Gastroenterology. 2009;15(19):2401-5. doi:10.3748/wjg.15.2401. Capsule Endoscopy Retention: Is It a Complication?SABRA Riffle AS, Lewis BS. Journal of Clinical Gastroenterology. 2006;40(8):688-91. doi:10.1097/[id number].   Patients with recurrent small bowel obstructions, particularly those with strictures, should avoid high-fiber, fibrous, and poorly chewed foods such as raw fruits and vegetables, tough meats, and foods with indigestible skins or seeds, as these can precipitate obstructive symptoms.[1][2][3] Fibrous plant-based foods are especially problematic if not thoroughly chewed or adequately cooked, as their texture can trigger obstruction at sites of narrowing.  Soft, well-cooked, and processed foods--such as applesauce instead of raw apples--are generally better tolerated, and careful chewing is strongly recommended.[1][2] Cooking and processing fruits and vegetables to a soft consistency can help patients safely incorporate a wider variety of foods.  For patients with adhesive or partial small bowel obstruction, elemental or low-residue diets may be considered to minimize luminal bulk and reduce symptoms.[4][5] These diets are designed to be easily absorbed and to leave minimal residue in the bowel, reducing the risk of further obstruction.  In patients with short bowel syndrome and a colon in continuity, oxalate-rich foods (such as peanuts and baked beans) should be restricted to prevent complications like hyperoxaluria and oxalate stones.[6] Dietary adjustments should be individualized based on patient tolerance, nutritional needs, and symptom development.  Guidance on specific foods to avoid for patients with SBO due to neoplasms or other rare causes is limited in the current literature. Dietary recommendations  should be tailored to the underlying etiology and patient-specific factors.   Would you like me to summarize the evidence supporting the effectiveness of low-residue or elemental diets in reducing recurrence rates or improving outcomes for patients with recurrent small bowel obstruction? References AGA Clinical Practice Update on Diet and Nutritional Therapies in Patients With Inflammatory Bowel Disease: Expert Review. Hashash JG, Elkins J, Lewis JD, Binion DG. Gastroenterology. 2024;166(3):521-532. doi:10.1053/j.gastro.2023.11.303. Management of Crohn Disease: A Review. Cushing MARLA Lush PDR. JAMA. 2021;325(1):69-80. doi:10.1001/jama.7979.81063. Intraluminal Causes of Mechanical Small Bowel Obstruction: CT Findings and Diagnostic Approach. Wong L, Sivanesan U, Haider M, Chung AD. European Journal of Radiology. 2025;187:112115. doi:10.1016/j.ejrad.706-362-6438. Outpatient Management of Partial Small Bowel Obstruction Using an Oral Palatable Elemental Diet. Mehravar S, Pirvaram A, Rezaie A. Digestive Diseases and Sciences. 2025;:10.1007/s10620-025-09219-0. doi:10.1007/s10620-025-09219-0. [place] Guidelines for Diagnosis and Management of Adhesive Small Bowel Obstruction (ASBO): 2017 Update of the Evidence-Based Guidelines From the World Society of Emergency Surgery ASBO Working Group. Ten Broek RPG, Krielen P, Di Saverio S, et al. World Journal of Emergency Surgery : WJES. 7981;86:75. doi:10.1186/s13017-820-038-3730-2. AGA Clinical Practice Update on Management of Short Bowel Syndrome: Expert Review. Cecile MARLA, DiBaise JK, Rubio-Tapia A. Clinical Gastroenterology and Hepatology : The Official Clinical Practice Journal of the American Gastroenterological Association. 2022;20(10):2185-2194.e2. doi:10.1016/j.cgh.2022.05.032.

## 2023-11-23 NOTE — Assessment & Plan Note (Signed)
 Stage 2 chronic kidney disease is age-related, with kidney function well-managed and within normal limits for age. Kidney function should continue to be monitored during annual physicals.

## 2023-11-23 NOTE — Assessment & Plan Note (Signed)
Check Hemoglobin A1c 

## 2023-11-23 NOTE — Assessment & Plan Note (Signed)
 Suspicious or fluid vs truncal adiposity - he deferred ultrasound offer

## 2023-11-23 NOTE — Assessment & Plan Note (Signed)
 Chronic allergic rhinitis with postnasal drip   Chronic throat clearing is likely due to postnasal drip from allergic rhinitis, with no significant nasal inflammation or heartburn symptoms. Saline nasal mist was recommended as a non-prescription treatment, and the mechanism of postnasal drip and its relation to throat clearing was explained. Steroid spray was discussed as an option if sneezing occurs. He should use saline nasal mist daily, especially after outdoor exposure, and consider steroid spray if sneezing occurs.

## 2023-11-23 NOTE — Assessment & Plan Note (Signed)
 Managed by urology

## 2023-11-23 NOTE — Assessment & Plan Note (Signed)
 ild cognitive symptoms, age-related   Mild cognitive symptoms are possibly age-related, with no significant sleep issues reported. Thyroid  function has not been recently checked, and the possibility of thyroid  issues contributing to cognitive symptoms was discussed. A thyroid  function test was ordered.

## 2023-11-23 NOTE — Assessment & Plan Note (Signed)
 Vitamin D supplementation (possible deficiency)   Vitamin D supplementation is ongoing, though no specific deficiency is diagnosed. The controversy around vitamin D supplementation and its potential benefits was discussed. He should continue vitamin D supplementation.

## 2023-11-24 LAB — TSH RFX ON ABNORMAL TO FREE T4: TSH: 2.07 u[IU]/mL (ref 0.450–4.500)

## 2023-11-24 LAB — HEMOGLOBIN A1C: Hgb A1c MFr Bld: 5.9 % (ref 4.6–6.5)

## 2023-11-26 ENCOUNTER — Ambulatory Visit: Payer: Self-pay | Admitting: Internal Medicine

## 2024-05-20 ENCOUNTER — Ambulatory Visit

## 2024-11-23 ENCOUNTER — Ambulatory Visit: Admitting: Internal Medicine
